# Patient Record
Sex: Female | Born: 1985 | Race: White | Hispanic: No | Marital: Married | Smoking: Never smoker
Health system: Southern US, Community
[De-identification: ages and names within clinical notes are randomized; demographics above are authoritative.]

## PROBLEM LIST (undated history)

## (undated) ENCOUNTER — Inpatient Hospital Stay (HOSPITAL_COMMUNITY): Payer: Self-pay

## (undated) DIAGNOSIS — F419 Anxiety disorder, unspecified: Secondary | ICD-10-CM

## (undated) DIAGNOSIS — G43909 Migraine, unspecified, not intractable, without status migrainosus: Secondary | ICD-10-CM

## (undated) DIAGNOSIS — F329 Major depressive disorder, single episode, unspecified: Secondary | ICD-10-CM

## (undated) DIAGNOSIS — F32A Depression, unspecified: Secondary | ICD-10-CM

## (undated) DIAGNOSIS — N200 Calculus of kidney: Secondary | ICD-10-CM

## (undated) DIAGNOSIS — R112 Nausea with vomiting, unspecified: Secondary | ICD-10-CM

## (undated) DIAGNOSIS — T8859XA Other complications of anesthesia, initial encounter: Secondary | ICD-10-CM

## (undated) DIAGNOSIS — Z9889 Other specified postprocedural states: Secondary | ICD-10-CM

## (undated) DIAGNOSIS — E0590 Thyrotoxicosis, unspecified without thyrotoxic crisis or storm: Secondary | ICD-10-CM

## (undated) DIAGNOSIS — T4145XA Adverse effect of unspecified anesthetic, initial encounter: Secondary | ICD-10-CM

## (undated) HISTORY — PX: APPENDECTOMY: SHX54

## (undated) HISTORY — PX: LITHOTRIPSY: SUR834

## (undated) HISTORY — DX: Anxiety disorder, unspecified: F41.9

## (undated) HISTORY — PX: CHOLECYSTECTOMY: SHX55

---
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
## 2007-07-17 ENCOUNTER — Ambulatory Visit (HOSPITAL_COMMUNITY): Admission: RE | Admit: 2007-07-17 | Discharge: 2007-07-17 | Payer: Self-pay | Admitting: Urology

## 2007-09-24 ENCOUNTER — Ambulatory Visit (HOSPITAL_COMMUNITY): Admission: RE | Admit: 2007-09-24 | Discharge: 2007-09-24 | Payer: Self-pay | Admitting: Surgery

## 2007-10-10 ENCOUNTER — Encounter: Payer: Self-pay | Admitting: Endocrinology

## 2007-10-13 ENCOUNTER — Ambulatory Visit (HOSPITAL_COMMUNITY): Admission: RE | Admit: 2007-10-13 | Discharge: 2007-10-13 | Payer: Self-pay | Admitting: Surgery

## 2007-10-17 ENCOUNTER — Encounter: Admission: RE | Admit: 2007-10-17 | Discharge: 2007-10-17 | Payer: Self-pay | Admitting: Surgery

## 2008-01-13 ENCOUNTER — Encounter: Payer: Self-pay | Admitting: Internal Medicine

## 2008-02-04 ENCOUNTER — Encounter: Payer: Self-pay | Admitting: Endocrinology

## 2008-03-02 ENCOUNTER — Ambulatory Visit (HOSPITAL_COMMUNITY): Admission: RE | Admit: 2008-03-02 | Discharge: 2008-03-02 | Payer: Self-pay | Admitting: Surgery

## 2008-08-18 ENCOUNTER — Encounter: Payer: Self-pay | Admitting: Endocrinology

## 2008-10-11 DIAGNOSIS — G44209 Tension-type headache, unspecified, not intractable: Secondary | ICD-10-CM | POA: Insufficient documentation

## 2008-10-12 ENCOUNTER — Ambulatory Visit: Payer: Self-pay | Admitting: Endocrinology

## 2008-10-12 DIAGNOSIS — E210 Primary hyperparathyroidism: Secondary | ICD-10-CM | POA: Insufficient documentation

## 2008-10-12 DIAGNOSIS — L680 Hirsutism: Secondary | ICD-10-CM | POA: Insufficient documentation

## 2008-10-12 DIAGNOSIS — R197 Diarrhea, unspecified: Secondary | ICD-10-CM | POA: Insufficient documentation

## 2008-10-12 DIAGNOSIS — F329 Major depressive disorder, single episode, unspecified: Secondary | ICD-10-CM | POA: Insufficient documentation

## 2008-10-12 DIAGNOSIS — R635 Abnormal weight gain: Secondary | ICD-10-CM | POA: Insufficient documentation

## 2008-10-12 DIAGNOSIS — N209 Urinary calculus, unspecified: Secondary | ICD-10-CM | POA: Insufficient documentation

## 2008-10-12 LAB — CONVERTED CEMR LAB
Cortisol, Plasma: 11.4 ug/dL
Prolactin: 17.9 ng/mL

## 2008-10-13 ENCOUNTER — Telehealth: Payer: Self-pay | Admitting: Endocrinology

## 2008-10-13 ENCOUNTER — Ambulatory Visit: Payer: Self-pay | Admitting: Endocrinology

## 2008-10-13 LAB — CONVERTED CEMR LAB: Cortisol, Plasma: 0.6 ug/dL

## 2008-10-26 LAB — CONVERTED CEMR LAB
Calcium, Total (PTH): 11.3 mg/dL — ABNORMAL HIGH (ref 8.4–10.5)
PTH: 91.8 pg/mL — ABNORMAL HIGH (ref 14.0–72.0)

## 2008-10-29 HISTORY — PX: PARATHYROIDECTOMY: SHX19

## 2008-12-14 ENCOUNTER — Encounter: Payer: Self-pay | Admitting: Endocrinology

## 2009-01-05 ENCOUNTER — Encounter: Payer: Self-pay | Admitting: Endocrinology

## 2009-01-13 ENCOUNTER — Encounter: Payer: Self-pay | Admitting: Endocrinology

## 2009-01-18 ENCOUNTER — Encounter: Payer: Self-pay | Admitting: Endocrinology

## 2009-10-31 ENCOUNTER — Ambulatory Visit (HOSPITAL_COMMUNITY): Admission: RE | Admit: 2009-10-31 | Discharge: 2009-10-31 | Payer: Self-pay | Admitting: Urology

## 2009-11-03 ENCOUNTER — Emergency Department (HOSPITAL_COMMUNITY): Admission: EM | Admit: 2009-11-03 | Discharge: 2009-11-04 | Payer: Self-pay | Admitting: Emergency Medicine

## 2009-11-22 ENCOUNTER — Ambulatory Visit (HOSPITAL_COMMUNITY): Admission: RE | Admit: 2009-11-22 | Discharge: 2009-11-22 | Payer: Self-pay | Admitting: Family Medicine

## 2009-12-16 ENCOUNTER — Encounter (INDEPENDENT_AMBULATORY_CARE_PROVIDER_SITE_OTHER): Payer: Self-pay | Admitting: General Surgery

## 2009-12-16 ENCOUNTER — Ambulatory Visit (HOSPITAL_COMMUNITY): Admission: RE | Admit: 2009-12-16 | Discharge: 2009-12-17 | Payer: Self-pay | Admitting: General Surgery

## 2010-10-11 ENCOUNTER — Emergency Department (HOSPITAL_BASED_OUTPATIENT_CLINIC_OR_DEPARTMENT_OTHER)
Admission: EM | Admit: 2010-10-11 | Discharge: 2010-10-12 | Payer: Self-pay | Source: Home / Self Care | Admitting: Emergency Medicine

## 2010-10-15 ENCOUNTER — Emergency Department (HOSPITAL_BASED_OUTPATIENT_CLINIC_OR_DEPARTMENT_OTHER)
Admission: EM | Admit: 2010-10-15 | Discharge: 2010-10-15 | Payer: Self-pay | Source: Home / Self Care | Admitting: Emergency Medicine

## 2010-10-29 NOTE — L&D Delivery Note (Signed)
Pt had pit aug added after her amniotomy. She progressed along a normal labor curve without complications. Second stage lasted about 1 hour. She had a SVD over 1st degree midline tear in ROA position. Placenta S/I. EBL-400cc. Tear closed with 3-0 chromic. Baby to NBN.

## 2010-11-19 ENCOUNTER — Encounter: Payer: Self-pay | Admitting: Surgery

## 2011-01-08 LAB — BASIC METABOLIC PANEL
BUN: 11 mg/dL (ref 6–23)
CO2: 21 mEq/L (ref 19–32)
Calcium: 9.5 mg/dL (ref 8.4–10.5)
Chloride: 110 mEq/L (ref 96–112)
Creatinine, Ser: 0.7 mg/dL (ref 0.4–1.2)
GFR calc Af Amer: >60 mL/min (ref >60)
GFR calc non Af Amer: >60 mL/min (ref >60)
Glucose, Bld: 104 mg/dL — ABNORMAL HIGH (ref 70–99)
Potassium: 3.9 mEq/L (ref 3.5–5.1)
Sodium: 144 mEq/L (ref 135–145)

## 2011-01-08 LAB — POCT TOXICOLOGY PANEL

## 2011-01-08 LAB — CBC
HCT: 40.9 % (ref 36.0–46.0)
Hemoglobin: 14 g/dL (ref 12.0–15.0)
MCH: 28.5 pg (ref 26.0–34.0)
MCHC: 34.2 g/dL (ref 30.0–36.0)
MCV: 83.1 fL (ref 78.0–100.0)
Platelets: 245 10*3/uL (ref 150–400)
RBC: 4.92 MIL/uL (ref 3.87–5.11)
RDW: 13.2 % (ref 11.5–15.5)
WBC: 5 10*3/uL (ref 4.0–10.5)

## 2011-01-08 LAB — DIFFERENTIAL
Basophils Absolute: 0 10*3/uL (ref 0.0–0.1)
Basophils Relative: 0 % (ref 0–1)
Eosinophils Absolute: 0 10*3/uL (ref 0.0–0.7)
Eosinophils Relative: 0 % (ref 0–5)
Lymphocytes Relative: 35 % (ref 12–46)
Lymphs Abs: 1.7 10*3/uL (ref 0.7–4.0)
Monocytes Absolute: 0.5 10*3/uL (ref 0.1–1.0)
Monocytes Relative: 9 % (ref 3–12)
Neutro Abs: 2.8 10*3/uL (ref 1.7–7.7)
Neutrophils Relative %: 56 % (ref 43–77)

## 2011-01-09 LAB — BASIC METABOLIC PANEL
BUN: 9 mg/dL (ref 6–23)
CO2: 19 mEq/L (ref 19–32)
Calcium: 10.1 mg/dL (ref 8.4–10.5)
Chloride: 109 mEq/L (ref 96–112)
Creatinine, Ser: 0.7 mg/dL (ref 0.4–1.2)
GFR calc Af Amer: >60 mL/min (ref >60)
GFR calc non Af Amer: >60 mL/min (ref >60)
Glucose, Bld: 94 mg/dL (ref 70–99)
Potassium: 4.8 mEq/L (ref 3.5–5.1)
Sodium: 143 mEq/L (ref 135–145)

## 2011-01-09 LAB — URINALYSIS, ROUTINE W REFLEX MICROSCOPIC
Bilirubin Urine: NEGATIVE
Glucose, UA: NEGATIVE mg/dL
Hgb urine dipstick: NEGATIVE
Ketones, ur: NEGATIVE mg/dL
Nitrite: NEGATIVE
Protein, ur: NEGATIVE mg/dL
Specific Gravity, Urine: 1.009 (ref 1.005–1.030)
Urobilinogen, UA: 0.2 mg/dL (ref 0.0–1.0)
pH: 6.5 (ref 5.0–8.0)

## 2011-01-09 LAB — MONONUCLEOSIS SCREEN: Mono Screen: NEGATIVE

## 2011-01-09 LAB — PREGNANCY, URINE: Preg Test, Ur: NEGATIVE

## 2011-01-13 LAB — LIPASE, BLOOD: Lipase: 17 U/L (ref 11–59)

## 2011-01-13 LAB — URINALYSIS, ROUTINE W REFLEX MICROSCOPIC
Bilirubin Urine: NEGATIVE
Glucose, UA: NEGATIVE mg/dL
Ketones, ur: 40 mg/dL — AB
Nitrite: NEGATIVE
Protein, ur: NEGATIVE mg/dL
Specific Gravity, Urine: 1.016 (ref 1.005–1.030)
Urobilinogen, UA: 0.2 mg/dL (ref 0.0–1.0)
pH: 6 (ref 5.0–8.0)

## 2011-01-13 LAB — DIFFERENTIAL
Basophils Absolute: 0 10*3/uL (ref 0.0–0.1)
Basophils Relative: 0 % (ref 0–1)
Eosinophils Absolute: 0 10*3/uL (ref 0.0–0.7)
Eosinophils Relative: 0 % (ref 0–5)
Lymphocytes Relative: 21 % (ref 12–46)
Lymphs Abs: 1.9 10*3/uL (ref 0.7–4.0)
Monocytes Absolute: 0.4 10*3/uL (ref 0.1–1.0)
Monocytes Relative: 5 % (ref 3–12)
Neutro Abs: 6.4 10*3/uL (ref 1.7–7.7)
Neutrophils Relative %: 73 % (ref 43–77)

## 2011-01-13 LAB — CBC
HCT: 42.1 % (ref 36.0–46.0)
Hemoglobin: 14.2 g/dL (ref 12.0–15.0)
MCHC: 33.6 g/dL (ref 30.0–36.0)
MCV: 88.6 fL (ref 78.0–100.0)
Platelets: 264 10*3/uL (ref 150–400)
RBC: 4.76 MIL/uL (ref 3.87–5.11)
RDW: 13.6 % (ref 11.5–15.5)
WBC: 8.7 10*3/uL (ref 4.0–10.5)

## 2011-01-13 LAB — COMPREHENSIVE METABOLIC PANEL
ALT: 126 U/L — ABNORMAL HIGH (ref 0–35)
AST: 67 U/L — ABNORMAL HIGH (ref 0–37)
Albumin: 4.5 g/dL (ref 3.5–5.2)
Alkaline Phosphatase: 55 U/L (ref 39–117)
BUN: 6 mg/dL (ref 6–23)
CO2: 26 mEq/L (ref 19–32)
Calcium: 9.7 mg/dL (ref 8.4–10.5)
Chloride: 106 mEq/L (ref 96–112)
Creatinine, Ser: 0.66 mg/dL (ref 0.4–1.2)
GFR calc Af Amer: >60 mL/min (ref >60)
GFR calc non Af Amer: >60 mL/min (ref >60)
Glucose, Bld: 112 mg/dL — ABNORMAL HIGH (ref 70–99)
Potassium: 3.9 mEq/L (ref 3.5–5.1)
Sodium: 142 mEq/L (ref 135–145)
Total Bilirubin: 0.5 mg/dL (ref 0.3–1.2)
Total Protein: 7.6 g/dL (ref 6.0–8.3)

## 2011-01-13 LAB — URINE MICROSCOPIC-ADD ON

## 2011-01-13 LAB — PREGNANCY, URINE: Preg Test, Ur: NEGATIVE

## 2011-01-14 LAB — PREGNANCY, URINE: Preg Test, Ur: NEGATIVE

## 2011-01-17 LAB — PREGNANCY, URINE: Preg Test, Ur: NEGATIVE
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
## 2011-03-16 NOTE — Discharge Summary (Signed)
[facility]  Patient:    Anita Beck, Anita Beck                      MRN: [id number] Adm. Date:  [id number] Disc. Date: [id number] Attending:  Veneta Penton                           Discharge Summary  REASON FOR ADMISSION:  This 25 year old white female was admitted complaining of depression with suicidal ideation and an attempt to kill herself by cutting her left wrist.  For further history of present illness, please see the patients psychiatric admission assessment.  PHYSICAL EXAMINATION:  At the time of admission was significant for obesity, asthma, and allergic rhinitis.  She had an otherwise unremarkable physical examination.  LABORATORY EXAMINATION:  The patient underwent a laboratory work-up to rule out any medical problems contributing to her symptomatology.  A urine probe for gonorrhea and chlamydia was negative.  A routine chem panel was within normal limits, with exception of a calcium of 11.0.  A hepatic panel was within normal limits.  CBC showed an RDW of 14.1% and was otherwise unremarkable.  Urine pregnancy test was negative.  Thyroid function tests was within normal limits.  A urine drug screen was negative.  An RPR was nonreactive.  UA was within normal limits.  Patient received no x-rays, no special procedures, no additional consultations.  She sustained no complications during the course of this hospitalization.  HOSPITAL COURSE:  On admission, the patient was depressed, irritable, angry, oppositional and defiant.  She showed poor impulse control, a furrowed brow, decreased concentration.  She was psychomotor agitated.  She was continued on a trial of Celexa and titrated upwards to a therapeutic dose.  At the time of discharge, her affect and mood have improved.  She is participating in all aspects of the therapeutic treatment program.  She denies and homicidal or suicidal ideation.  She is motivated for outpatient therapy, has  been addressing her issues with regard to dealing with the separation and divorce of her parents as well as the breakup she had with a boyfriend that she was close to.  She is felt to have reached the maximum benefits of hospitalization and is ready for discharge to a less restricted alternative setting.  CONDITION ON DISCHARGE:  Improved  FINAL DIAGNOSIS: Axis I:    1. Major depression, single episode, severe, without psychosis.            2. Oppositional-defiant disorder. Axis II:   None. Axis III:  Obesity, asthma, allergic rhinitis. Axis IV:   Current psychosocial stressors are severe. Axis V:    Code 20 on admission, code 30 on discharge.  FURTHER EVALUATION AND TREATMENT RECOMMENDATIONS: 1. The patient is discharged to home. 2. She is discharged on an unrestricted level of activity and a regular diet. 3. She will follow up with her primary care physician, Dr. Foy Guadalajara for further    medication management and all other aspects of her health care.  She will    also follow up with Ripley Fraise, her outpatient therapist, for all further    psychotherapy. Consequently, I will sign off on the case at this time.  DISCHARGE MEDICATIONS: 1. Celexa 20 mg p.o. q.d. 2. Albuterol inhaler as directed by her primary care physician for wheezing. DD:  02/20/01 TD:  02/20/01 Job: [id number]

## 2011-03-26 ENCOUNTER — Inpatient Hospital Stay (HOSPITAL_COMMUNITY): Payer: BC Managed Care – PPO

## 2011-03-26 ENCOUNTER — Encounter (HOSPITAL_COMMUNITY): Payer: Self-pay | Admitting: Radiology

## 2011-03-26 ENCOUNTER — Inpatient Hospital Stay (HOSPITAL_COMMUNITY)
Admission: AD | Admit: 2011-03-26 | Discharge: 2011-03-26 | Disposition: A | Discharge status: Home / Self Care | Payer: BC Managed Care – PPO | Source: Ambulatory Visit | Attending: Obstetrics and Gynecology | Admitting: Obstetrics and Gynecology

## 2011-03-26 DIAGNOSIS — R109 Unspecified abdominal pain: Secondary | ICD-10-CM | POA: Insufficient documentation

## 2011-03-26 DIAGNOSIS — R52 Pain, unspecified: Secondary | ICD-10-CM

## 2011-03-26 DIAGNOSIS — O99891 Other specified diseases and conditions complicating pregnancy: Secondary | ICD-10-CM | POA: Insufficient documentation

## 2011-03-26 LAB — URINALYSIS, ROUTINE W REFLEX MICROSCOPIC
Bilirubin Urine: NEGATIVE
Glucose, UA: NEGATIVE mg/dL
Hgb urine dipstick: NEGATIVE
Ketones, ur: NEGATIVE mg/dL
Nitrite: NEGATIVE
Protein, ur: NEGATIVE mg/dL
Specific Gravity, Urine: 1.025 (ref 1.005–1.030)
Urobilinogen, UA: 0.2 mg/dL (ref 0.0–1.0)
pH: 6 (ref 5.0–8.0)

## 2011-03-26 LAB — POCT PREGNANCY, URINE: Preg Test, Ur: POSITIVE

## 2011-03-26 LAB — URINE MICROSCOPIC-ADD ON

## 2011-05-04 ENCOUNTER — Other Ambulatory Visit (HOSPITAL_COMMUNITY): Payer: Self-pay | Admitting: Obstetrics and Gynecology

## 2011-05-04 DIAGNOSIS — Z3682 Encounter for antenatal screening for nuchal translucency: Secondary | ICD-10-CM

## 2011-05-10 ENCOUNTER — Ambulatory Visit (HOSPITAL_COMMUNITY)
Admission: RE | Admit: 2011-05-10 | Discharge: 2011-05-10 | Disposition: A | Discharge status: Home / Self Care | Payer: PRIVATE HEALTH INSURANCE | Source: Ambulatory Visit | Attending: Obstetrics and Gynecology | Admitting: Obstetrics and Gynecology

## 2011-05-10 ENCOUNTER — Ambulatory Visit (HOSPITAL_COMMUNITY): Admission: RE | Admit: 2011-05-10 | Payer: PRIVATE HEALTH INSURANCE | Source: Ambulatory Visit

## 2011-05-10 ENCOUNTER — Other Ambulatory Visit (HOSPITAL_COMMUNITY): Payer: Self-pay | Admitting: Obstetrics and Gynecology

## 2011-05-10 ENCOUNTER — Encounter (HOSPITAL_COMMUNITY): Payer: Self-pay

## 2011-05-10 DIAGNOSIS — E669 Obesity, unspecified: Secondary | ICD-10-CM | POA: Insufficient documentation

## 2011-05-10 DIAGNOSIS — Z3689 Encounter for other specified antenatal screening: Secondary | ICD-10-CM | POA: Insufficient documentation

## 2011-05-10 DIAGNOSIS — Z0489 Encounter for examination and observation for other specified reasons: Secondary | ICD-10-CM

## 2011-05-10 DIAGNOSIS — Z3682 Encounter for antenatal screening for nuchal translucency: Secondary | ICD-10-CM

## 2011-05-10 DIAGNOSIS — O351XX0 Maternal care for (suspected) chromosomal abnormality in fetus, not applicable or unspecified: Secondary | ICD-10-CM | POA: Insufficient documentation

## 2011-05-10 DIAGNOSIS — O3510X0 Maternal care for (suspected) chromosomal abnormality in fetus, unspecified, not applicable or unspecified: Secondary | ICD-10-CM | POA: Insufficient documentation

## 2011-05-14 LAB — ANTIBODY SCREEN: Antibody Screen: NEGATIVE

## 2011-05-14 LAB — RPR: RPR: NONREACTIVE

## 2011-05-14 LAB — ABO/RH: RH Type: POSITIVE

## 2011-05-14 LAB — HIV ANTIBODY (ROUTINE TESTING W REFLEX): HIV: NONREACTIVE

## 2011-05-14 LAB — HEPATITIS B SURFACE ANTIGEN: Hepatitis B Surface Ag: NEGATIVE

## 2011-05-14 LAB — RUBELLA ANTIBODY, IGM: Rubella: IMMUNE

## 2011-06-15 ENCOUNTER — Other Ambulatory Visit (HOSPITAL_COMMUNITY): Payer: Self-pay | Admitting: Obstetrics and Gynecology

## 2011-06-15 ENCOUNTER — Ambulatory Visit (HOSPITAL_COMMUNITY)
Admission: RE | Admit: 2011-06-15 | Discharge: 2011-06-15 | Disposition: A | Discharge status: Home / Self Care | Payer: PRIVATE HEALTH INSURANCE | Source: Ambulatory Visit | Attending: Obstetrics and Gynecology | Admitting: Obstetrics and Gynecology

## 2011-06-15 DIAGNOSIS — Z0489 Encounter for examination and observation for other specified reasons: Secondary | ICD-10-CM

## 2011-06-15 DIAGNOSIS — IMO0002 Reserved for concepts with insufficient information to code with codable children: Secondary | ICD-10-CM

## 2011-06-15 DIAGNOSIS — O358XX0 Maternal care for other (suspected) fetal abnormality and damage, not applicable or unspecified: Secondary | ICD-10-CM | POA: Insufficient documentation

## 2011-06-15 DIAGNOSIS — O99210 Obesity complicating pregnancy, unspecified trimester: Secondary | ICD-10-CM | POA: Insufficient documentation

## 2011-06-15 DIAGNOSIS — E669 Obesity, unspecified: Secondary | ICD-10-CM | POA: Insufficient documentation

## 2011-06-15 NOTE — Progress Notes (Signed)
Patient seen for ultrasound only appointment today.  Please see [facility]-OBGYN report for details.  

## 2011-06-22 ENCOUNTER — Encounter (HOSPITAL_COMMUNITY): Payer: Self-pay | Admitting: *Deleted

## 2011-06-22 ENCOUNTER — Inpatient Hospital Stay (HOSPITAL_COMMUNITY)
Admission: AD | Admit: 2011-06-22 | Discharge: 2011-06-22 | Disposition: A | Discharge status: Home / Self Care | Payer: PRIVATE HEALTH INSURANCE | Source: Ambulatory Visit | Attending: Obstetrics & Gynecology | Admitting: Obstetrics & Gynecology

## 2011-06-22 DIAGNOSIS — O99891 Other specified diseases and conditions complicating pregnancy: Secondary | ICD-10-CM | POA: Insufficient documentation

## 2011-06-22 DIAGNOSIS — R630 Anorexia: Secondary | ICD-10-CM

## 2011-06-22 DIAGNOSIS — R42 Dizziness and giddiness: Secondary | ICD-10-CM | POA: Insufficient documentation

## 2011-06-22 DIAGNOSIS — O26899 Other specified pregnancy related conditions, unspecified trimester: Secondary | ICD-10-CM

## 2011-06-22 DIAGNOSIS — R51 Headache: Secondary | ICD-10-CM

## 2011-06-22 HISTORY — DX: Depression, unspecified: F32.A

## 2011-06-22 HISTORY — DX: Thyrotoxicosis, unspecified without thyrotoxic crisis or storm: E05.90

## 2011-06-22 HISTORY — DX: Major depressive disorder, single episode, unspecified: F32.9

## 2011-06-22 LAB — GLUCOSE, CAPILLARY: Glucose-Capillary: 89 mg/dL (ref 70–99)

## 2011-06-22 LAB — URINALYSIS, ROUTINE W REFLEX MICROSCOPIC
Bilirubin Urine: NEGATIVE
Glucose, UA: NEGATIVE mg/dL
Hgb urine dipstick: NEGATIVE
Ketones, ur: NEGATIVE mg/dL
Leukocytes, UA: NEGATIVE
Nitrite: NEGATIVE
Protein, ur: NEGATIVE mg/dL
Specific Gravity, Urine: 1.015 (ref 1.005–1.030)
Urobilinogen, UA: 0.2 mg/dL (ref 0.0–1.0)
pH: 7 (ref 5.0–8.0)

## 2011-06-22 MED ORDER — BUTALBITAL-APAP-CAFFEINE 50-325-40 MG PO TABS
1.0000 | ORAL_TABLET | Freq: Once | ORAL | Status: AC
  Administered 2011-06-22: 1 via ORAL
  Filled 2011-06-22: qty 1

## 2011-06-22 MED ORDER — ACETAMINOPHEN 325 MG PO TABS
650.0000 mg | ORAL_TABLET | Freq: Once | ORAL | Status: AC
  Administered 2011-06-22: 650 mg via ORAL
  Filled 2011-06-22: qty 2

## 2011-06-22 MED ORDER — BUTALBITAL-APAP-CAFFEINE 50-325-40 MG PO TABS: 1.0000 | ORAL_TABLET | Freq: Four times a day (QID) | ORAL | Status: DC | PRN

## 2011-06-22 NOTE — ED Provider Notes (Signed)
History     CSN: [id number] Arrival date & time: 06/22/2011  4:39 PM  Chief Complaint  Patient presents with  . Back Pain  . Leg Swelling  . Headache  . Dizziness   HPI  Anita Beck, 25 y.o. patient of Dr. Kittie Plater presents with a 2 day hx of dizziness at work.  "coming more often"  Had heavy menstrual like cramping early this am and more dizziness at work which has resolved.  Took Tylenol PM this am to sleep.  Woke with cramping in her legs, headache, numbness in her hands, and back pain.  Had migraines last Dec, treated X 2  In the ED.  Hasn't had them since.   Denies uterine contractions, vaginal bleeding or leaking of fluid.  Patient states she is unable to eat because chewing makes her sick.  She is also going most of her 12 hr shifts without drinking very much and not eating.  She is working 3 12hrs shifts in a row.   Past Medical History  Diagnosis Date  . Hyperthyroidism   . Depression     Past Surgical History  Procedure Date  . No past surgeries     No family history on file.  History  Substance Use Topics  . Smoking status: Never Smoker   . Smokeless tobacco: Not on file  . Alcohol Use: No    OB History    Grav Para Term Preterm Abortions TAB SAB Ect Mult Living   1               Review of Systems  Musculoskeletal: Positive for back pain.  Neurological: Positive for dizziness, numbness (in hands) and headaches.    Physical Exam  BP 128/65  Pulse 95  Temp 99.1 F (37.3 C)  Resp 20  Ht 5\' 5"  (1.651 m)  Wt 240 lb 6 oz (109.033 kg)  BMI 40.00 kg/m2  LMP 01/29/2011  Physical Exam  Constitutional: She is oriented to person, place, and time. She appears well-developed and well-nourished.  Neck: Normal range of motion.  Abdominal: Soft. There is no tenderness.  Genitourinary:       Patient declined vaginal exam  Neurological: She is alert and oriented to person, place, and time.  Skin: Skin is warm and dry.    ED Course  Procedures UA, CBG  and Tylenol 650mg  po ordered.   Patient was offered a meal.  She asked for Coke and saltine crackers.   MDM Called Dr. Aldona Bar at 19:25 regarding Patient's complaint.  He states he gave orders for NP to evaluate the patient.  I was not aware of this and will be glad to evaluate the patient.   Dr. Aldona Bar came by and asked about patient.  Order given for Fioricet at time of discharge for headache.     Assessment:  Headache at [redacted]wks gestation                         Dizziness                        Anorexia  Plan:  Strongly encouraged patient to eat small meals often and stay well hydrated to reduce muscle cramping and headaches.  \           Keep scheduled appointment with Dr. Henderson Cloud and call her if her sxs worsen           Rx given for  Fioricet #1 tab q6hr prn headache.  #10 with no RF         Note given for out of work tonight.    At time of discharge, patient reported to Victorino Dike, RN that her pharmacy is now closed and asked if she could have 1 Fioricet tab here.  Fioricet 1 tab po ordered for patient.

## 2011-06-22 NOTE — Discharge Instructions (Signed)
It is very important to find foods that you can tolerate.  Eat small meals 5-6 times a day.  Also drink 6-8 glasses of fluid a day to reduce dehydration, muscle cramping, dizziness and headache.

## 2011-06-22 NOTE — Progress Notes (Signed)
Pt states, " I started having low back cramping and dizziness at 0500 today while at work. I took tylenol pm when I got home and slept until 2 pm. Now the dizziness is worse and I have a headache. My hands and arms hurt, and feel swollen, and my legs cramp and my feet numb."

## 2011-07-13 ENCOUNTER — Ambulatory Visit (HOSPITAL_COMMUNITY): Payer: PRIVATE HEALTH INSURANCE

## 2011-08-09 LAB — PREGNANCY, URINE: Preg Test, Ur: NEGATIVE

## 2011-09-23 ENCOUNTER — Encounter (HOSPITAL_COMMUNITY): Payer: Self-pay | Admitting: Obstetrics and Gynecology

## 2011-09-23 ENCOUNTER — Inpatient Hospital Stay (HOSPITAL_COMMUNITY)
Admission: AD | Admit: 2011-09-23 | Discharge: 2011-09-25 | DRG: 778 | Disposition: A | Discharge status: Home / Self Care | Payer: PRIVATE HEALTH INSURANCE | Source: Ambulatory Visit | Attending: Obstetrics & Gynecology | Admitting: Obstetrics & Gynecology

## 2011-09-23 DIAGNOSIS — O139 Gestational [pregnancy-induced] hypertension without significant proteinuria, unspecified trimester: Secondary | ICD-10-CM | POA: Diagnosis present

## 2011-09-23 DIAGNOSIS — O4700 False labor before 37 completed weeks of gestation, unspecified trimester: Principal | ICD-10-CM | POA: Diagnosis present

## 2011-09-23 HISTORY — DX: Migraine, unspecified, not intractable, without status migrainosus: G43.909

## 2011-09-23 HISTORY — DX: Calculus of kidney: N20.0

## 2011-09-23 LAB — CBC
HCT: 34.1 % — ABNORMAL LOW (ref 36.0–46.0)
Hemoglobin: 11.1 g/dL — ABNORMAL LOW (ref 12.0–15.0)
MCH: 28.2 pg (ref 26.0–34.0)
MCHC: 32.6 g/dL (ref 30.0–36.0)
MCV: 86.5 fL (ref 78.0–100.0)
Platelets: 183 10*3/uL (ref 150–400)
RBC: 3.94 MIL/uL (ref 3.87–5.11)
RDW: 13.9 % (ref 11.5–15.5)
WBC: 7.9 10*3/uL (ref 4.0–10.5)

## 2011-09-23 LAB — COMPREHENSIVE METABOLIC PANEL
ALT: 15 U/L (ref 0–35)
AST: 27 U/L (ref 0–37)
Albumin: 2.7 g/dL — ABNORMAL LOW (ref 3.5–5.2)
Alkaline Phosphatase: 93 U/L (ref 39–117)
BUN: 3 mg/dL — ABNORMAL LOW (ref 6–23)
CO2: 23 mEq/L (ref 19–32)
Calcium: 9.6 mg/dL (ref 8.4–10.5)
Chloride: 102 mEq/L (ref 96–112)
Creatinine, Ser: 0.49 mg/dL — ABNORMAL LOW (ref 0.50–1.10)
GFR calc Af Amer: >90 mL/min (ref >90)
GFR calc non Af Amer: >90 mL/min (ref >90)
Glucose, Bld: 82 mg/dL (ref 70–99)
Potassium: 3.4 mEq/L — ABNORMAL LOW (ref 3.5–5.1)
Sodium: 136 mEq/L (ref 135–145)
Total Bilirubin: 0.2 mg/dL — ABNORMAL LOW (ref 0.3–1.2)
Total Protein: 5.9 g/dL — ABNORMAL LOW (ref 6.0–8.3)

## 2011-09-23 LAB — DIFFERENTIAL
Basophils Absolute: 0 10*3/uL (ref 0.0–0.1)
Basophils Relative: 0 % (ref 0–1)
Eosinophils Absolute: 0 10*3/uL (ref 0.0–0.7)
Eosinophils Relative: 0 % (ref 0–5)
Lymphocytes Relative: 17 % (ref 12–46)
Lymphs Abs: 1.3 10*3/uL (ref 0.7–4.0)
Monocytes Absolute: 0.6 10*3/uL (ref 0.1–1.0)
Monocytes Relative: 8 % (ref 3–12)
Neutro Abs: 6 10*3/uL (ref 1.7–7.7)
Neutrophils Relative %: 76 % (ref 43–77)

## 2011-09-23 LAB — URINALYSIS, ROUTINE W REFLEX MICROSCOPIC
Bilirubin Urine: NEGATIVE
Glucose, UA: NEGATIVE mg/dL
Hgb urine dipstick: NEGATIVE
Ketones, ur: NEGATIVE mg/dL
Nitrite: NEGATIVE
Protein, ur: NEGATIVE mg/dL
Specific Gravity, Urine: 1.025 (ref 1.005–1.030)
Urobilinogen, UA: 0.2 mg/dL (ref 0.0–1.0)
pH: 6 (ref 5.0–8.0)

## 2011-09-23 LAB — STREP B DNA PROBE: GBS: POSITIVE

## 2011-09-23 LAB — URINE MICROSCOPIC-ADD ON

## 2011-09-23 LAB — FETAL FIBRONECTIN: Fetal Fibronectin: POSITIVE — AB

## 2011-09-23 MED ORDER — SULFAMETHOXAZOLE-TMP DS 800-160 MG PO TABS
1.0000 | ORAL_TABLET | Freq: Once | ORAL | Status: AC
  Administered 2011-09-23: 1 via ORAL
  Filled 2011-09-23: qty 1

## 2011-09-23 MED ORDER — DOCUSATE SODIUM 100 MG PO CAPS
100.0000 mg | ORAL_CAPSULE | Freq: Every day | ORAL | Status: DC
  Administered 2011-09-23 – 2011-09-24 (×2): 100 mg via ORAL
  Filled 2011-09-23 (×5): qty 1

## 2011-09-23 MED ORDER — LACTATED RINGERS IV SOLN
INTRAVENOUS | Status: DC
  Administered 2011-09-23 – 2011-09-25 (×5): via INTRAVENOUS

## 2011-09-23 MED ORDER — PENICILLIN G POTASSIUM 5000000 UNITS IJ SOLR
2.5000 10*6.[IU] | INTRAVENOUS | Status: DC
  Administered 2011-09-23 – 2011-09-24 (×4): 2.5 10*6.[IU] via INTRAVENOUS
  Filled 2011-09-23 (×6): qty 2.5

## 2011-09-23 MED ORDER — BUTORPHANOL TARTRATE 2 MG/ML IJ SOLN: 1.0000 mg | INTRAMUSCULAR | Status: DC | PRN

## 2011-09-23 MED ORDER — BETAMETHASONE SOD PHOS & ACET 6 (3-3) MG/ML IJ SUSP
12.0000 mg | INTRAMUSCULAR | Status: AC
  Administered 2011-09-24: 12 mg via INTRAMUSCULAR
  Filled 2011-09-23: qty 2

## 2011-09-23 MED ORDER — MAGNESIUM SULFATE BOLUS VIA INFUSION
4.0000 g | Freq: Once | INTRAVENOUS | Status: AC
  Administered 2011-09-23: 4 g via INTRAVENOUS
  Filled 2011-09-23: qty 500

## 2011-09-23 MED ORDER — MAGNESIUM SULFATE 40 G IN LACTATED RINGERS - SIMPLE
2.0000 g/h | INTRAVENOUS | Status: DC
  Administered 2011-09-23: 2 g/h via INTRAVENOUS
  Filled 2011-09-23: qty 500

## 2011-09-23 MED ORDER — ONDANSETRON 8 MG PO TBDP
8.0000 mg | ORAL_TABLET | Freq: Once | ORAL | Status: DC
  Filled 2011-09-23 (×2): qty 1

## 2011-09-23 MED ORDER — ZOLPIDEM TARTRATE 10 MG PO TABS
10.0000 mg | ORAL_TABLET | Freq: Every evening | ORAL | Status: DC | PRN
  Administered 2011-09-23 – 2011-09-24 (×2): 10 mg via ORAL
  Filled 2011-09-23 (×2): qty 1

## 2011-09-23 MED ORDER — ACETAMINOPHEN 325 MG PO TABS
650.0000 mg | ORAL_TABLET | ORAL | Status: DC | PRN
  Administered 2011-09-23: 650 mg via ORAL
  Filled 2011-09-23: qty 2

## 2011-09-23 MED ORDER — BETAMETHASONE SOD PHOS & ACET 6 (3-3) MG/ML IJ SUSP: 12.0000 mg | Freq: Once | INTRAMUSCULAR | Status: DC

## 2011-09-23 MED ORDER — PENICILLIN G POTASSIUM 5000000 UNITS IJ SOLR
5.0000 10*6.[IU] | Freq: Once | INTRAVENOUS | Status: AC
  Administered 2011-09-23: 5 10*6.[IU] via INTRAVENOUS
  Filled 2011-09-23: qty 5

## 2011-09-23 MED ORDER — NIFEDIPINE 10 MG PO CAPS
10.0000 mg | ORAL_CAPSULE | Freq: Once | ORAL | Status: AC
  Administered 2011-09-23: 10 mg via ORAL
  Filled 2011-09-23: qty 1

## 2011-09-23 MED ORDER — PROMETHAZINE HCL 25 MG/ML IJ SOLN
25.0000 mg | Freq: Once | INTRAMUSCULAR | Status: AC
  Administered 2011-09-23: 25 mg via INTRAVENOUS
  Filled 2011-09-23: qty 1

## 2011-09-23 MED ORDER — BETAMETHASONE SOD PHOS & ACET 6 (3-3) MG/ML IJ SUSP
12.0000 mg | Freq: Once | INTRAMUSCULAR | Status: AC
  Administered 2011-09-23: 12 mg via INTRAMUSCULAR
  Filled 2011-09-23: qty 2

## 2011-09-23 MED ORDER — PRENATAL PLUS 27-1 MG PO TABS
1.0000 | ORAL_TABLET | Freq: Every day | ORAL | Status: DC
  Administered 2011-09-23: 1 via ORAL
  Filled 2011-09-23 (×4): qty 1

## 2011-09-23 MED ORDER — OXYCODONE-ACETAMINOPHEN 5-325 MG PO TABS
1.0000 | ORAL_TABLET | Freq: Four times a day (QID) | ORAL | Status: DC | PRN
  Administered 2011-09-23: 2 via ORAL
  Administered 2011-09-24 (×3): 1 via ORAL
  Administered 2011-09-25: 2 via ORAL
  Filled 2011-09-23 (×3): qty 1
  Filled 2011-09-23 (×2): qty 2

## 2011-09-23 NOTE — Progress Notes (Signed)
Pt presents to MAU with chief complaint of vaginal pressure that started yesterday. Says she feels like she has to go to the bathroom, but does not go when she feels like she has to. She is also experiencing N/V. 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

## 2011-09-23 NOTE — ED Provider Notes (Signed)
History     Chief Complaint  Patient presents with  . Abdominal Pain  . Emesis   HPI . OB History    Grav Para Term Preterm Abortions TAB SAB Ect Mult Living   1 0 0 0 0 0 0 0 0 0       Past Medical History  Diagnosis Date  . Hyperthyroidism   . Depression   . Kidney stones     Past Surgical History  Procedure Date  . No past surgeries   . Cholecystectomy   . Appendectomy   . Parathyroidectomy   . Lithotripsy     No family history on file.  History  Substance Use Topics  . Smoking status: Never Smoker   . Smokeless tobacco: Not on file  . Alcohol Use: No    Allergies:  Allergies  Allergen Reactions  . Aspirin Anaphylaxis    Prescriptions prior to admission  Medication Sig Dispense Refill  . butalbital-acetaminophen-caffeine (FIORICET) 50-325-40 MG per tablet Take 1 tablet by mouth every 6 (six) hours as needed for headache.  10 tablet  0  . cyclobenzaprine (FLEXERIL) 5 MG tablet Take 5 mg by mouth daily as needed. For headache       . diphenhydramine-acetaminophen (TYLENOL PM) 25-500 MG TABS Take 1 tablet by mouth at bedtime as needed.        . Famotidine (PEPCID PO) Take 1 tablet by mouth daily as needed. Patient takes medication for heartburn.       Marland Kitchen omeprazole (PRILOSEC) 10 MG capsule Take 10 mg by mouth daily.        Marland Kitchen oxycodone-acetaminophen (ROXICET) 5-500 MG per tablet Take 0.5 tablets by mouth every 6 (six) hours as needed. For pain       . prenatal vitamin w/FE, FA (PRENATAL 1 + 1) 27-1 MG TABS Take 1 tablet by mouth daily.        . promethazine (PHENERGAN) 25 MG tablet Take 25 mg by mouth as needed. For nausea         Review of Systems  Gastrointestinal: Positive for nausea, vomiting and abdominal pain.  Musculoskeletal: Positive for back pain.   Physical Exam   Blood pressure 139/79, pulse 103, temperature 98.3 F (36.8 C), temperature source Oral, resp. rate 20, height 5\' 6"  (1.676 m), weight 252 lb 3.2 oz (114.397 kg), last menstrual  period 01/29/2011.  Physical Exam  Nursing note and vitals reviewed. Constitutional: She is oriented to person, place, and time. She appears well-developed and well-nourished.  HENT:  Head: Normocephalic.  Eyes: EOM are normal.  Neck: Neck supple.  GI: Soft. There is tenderness. There is no rebound and no guarding.       Has pain in upper abdomen - above the fundus. On fetal monitor, irreg contractions   Genitourinary:       Speculum exam: Vulva - negative Vagina - Small amount of creamy discharge, no odor Cervix - No contact bleeding, fetal fibronectin collected Bimanual exam: Cervix very soft, external os open, internal os FT and very soft, babys head near cervix but at -2 station, Uterus gravid Adnexa non tender, no masses bilaterally Chaperone present for exam.  Musculoskeletal: Normal range of motion.  Neurological: She is alert and oriented to person, place, and time.  Skin: Skin is warm and dry.  Psychiatric: She has a normal mood and affect.    MAU Course  Procedures  MDM Discussed plan of care with Dr. Aldona Bar, and updated Dr. Aldona Bar again.  IV fluids  infused.  Talked with client about plan of care and medications.  Contractions barely visible on monitor, however client reports increased pressure.  Cervical exam = now 1 cm and soft.  Dr. Aldona Bar notified.  Assessment and Plan  UTI Preterm cervical changes  Plan Dr. Aldona Bar in to see client and check cervix.  Dr. Aldona Bar assumes care.  BURLESON,TERRI 09/23/2011, 1:36 PM   Nolene Bernheim, NP 09/23/11 1619

## 2011-09-23 NOTE — Progress Notes (Addendum)
Pt reports having lower abd pain  and cramping since yesterday. Reports having one episodes of vomiting this morning. " I just don't feel good today". Has been treated for a migraine for past week and a half with percocet phenergan and flexaril. Still has headache now. Medication helps but it make her sleep and when she wakes up the headache starts again.

## 2011-09-23 NOTE — H&P (Signed)
25 year old G1P0 at 42 weeks and 5 days pregnant admitted for PreTerm Labor - with documented cervical change.  Anita Beck has not been feeling well for 24-36 hours-back discomfort, abdominal cramping and some nausea.  Worked yesterday.  Today presented at MAU for evaluation and initially Cx was ext os FT and on my exam around 4PM was a good 1CM+/50-60% effaced/Vtx-3.  Contractions are difficult to pick up on the monitor but the patient says they are most noticeable when she is standing or walking.  There has been a question of S>D recently.  Headaches has also been a recent problem.  Fibronectin in MAU was POSITIVE.  ALLERGIC to ASA.  PE:  VS stable.  No fever Chest - clear CV - RR without murmur Abd:  S+ 34/35.  FH+ CX: 1+/50-60/vtx-2-3  IMP:  31-32 weeks IUP          PTL with documented cervical change and positive fibronectin          S>D          Obesity          R/O UTI  (C/S pending)  Plan:  IV Magnesium Sulfate, Betamethasone protocol, GBS pending--will give IV PCN until culture returns, U/S 11/26.

## 2011-09-24 ENCOUNTER — Inpatient Hospital Stay (HOSPITAL_COMMUNITY): Payer: PRIVATE HEALTH INSURANCE

## 2011-09-24 LAB — URINE CULTURE
Colony Count: NO GROWTH
Culture  Setup Time: 201211251644
Culture: NO GROWTH

## 2011-09-24 LAB — MAGNESIUM: Magnesium: 3.6 mg/dL — ABNORMAL HIGH (ref 1.5–2.5)

## 2011-09-24 MED ORDER — NIFEDIPINE ER 30 MG PO TB24
30.0000 mg | ORAL_TABLET | Freq: Every day | ORAL | Status: DC
  Administered 2011-09-24 – 2011-09-25 (×2): 30 mg via ORAL
  Filled 2011-09-24 (×3): qty 1

## 2011-09-24 NOTE — Progress Notes (Signed)
UR chart review completed.  

## 2011-09-24 NOTE — Progress Notes (Addendum)
Patient is eating, voiding.  Denies contraction pain, good FM.  No bleeding.  +HA  Filed Vitals:   09/24/11 1610 09/24/11 1035 09/24/11 1124 09/24/11 1146  BP:      Pulse:      Temp:      TempSrc:      Resp: 20 20 20 20   Height:      Weight:      SpO2:        Abd; NT Ext: NT  Lab Results  Component Value Date   WBC 7.9 09/23/2011   HGB 11.1* 09/23/2011   HCT 34.1* 09/23/2011   MCV 86.5 09/23/2011   PLT 183 09/23/2011       A/P 31.6 with preterm labor / GHTN Discontinue magnesium sulfate Start procardia XL 30 mg qd Discontinue PCN, if begins contracting again, will consider restart NST q shift Cont. Toco. [place]: EFW 83%, repeat scan 4 weeks Mild range BPs > 6 hrs apart, will check labs in am.  Urine 24 prot/cr cl today. Philip Aspen

## 2011-09-25 ENCOUNTER — Encounter (HOSPITAL_COMMUNITY): Payer: Self-pay | Admitting: *Deleted

## 2011-09-25 LAB — COMPREHENSIVE METABOLIC PANEL
ALT: 12 U/L (ref 0–35)
AST: 15 U/L (ref 0–37)
Albumin: 2.6 g/dL — ABNORMAL LOW (ref 3.5–5.2)
Alkaline Phosphatase: 91 U/L (ref 39–117)
BUN: 4 mg/dL — ABNORMAL LOW (ref 6–23)
CO2: 21 mEq/L (ref 19–32)
Calcium: 9.3 mg/dL (ref 8.4–10.5)
Chloride: 107 mEq/L (ref 96–112)
Creatinine, Ser: 0.52 mg/dL (ref 0.50–1.10)
GFR calc Af Amer: >90 mL/min (ref >90)
GFR calc non Af Amer: >90 mL/min (ref >90)
Glucose, Bld: 139 mg/dL — ABNORMAL HIGH (ref 70–99)
Potassium: 4.1 mEq/L (ref 3.5–5.1)
Sodium: 139 mEq/L (ref 135–145)
Total Bilirubin: 0.1 mg/dL — ABNORMAL LOW (ref 0.3–1.2)
Total Protein: 5.6 g/dL — ABNORMAL LOW (ref 6.0–8.3)

## 2011-09-25 LAB — CREATININE CLEARANCE, URINE, 24 HOUR
Collection Interval-CRCL: 24 hours
Creatinine Clearance: 154 mL/min — ABNORMAL HIGH (ref 75–115)
Creatinine, 24H Ur: 1462 mg/d (ref 700–1800)
Creatinine, Urine: 29.09 mg/dL
Creatinine: 0.66 mg/dL (ref 0.50–1.10)
Urine Total Volume-CRCL: 5025 mL

## 2011-09-25 LAB — CBC
HCT: 33.3 % — ABNORMAL LOW (ref 36.0–46.0)
Hemoglobin: 10.8 g/dL — ABNORMAL LOW (ref 12.0–15.0)
MCH: 28.4 pg (ref 26.0–34.0)
MCHC: 32.4 g/dL (ref 30.0–36.0)
MCV: 87.6 fL (ref 78.0–100.0)
Platelets: 210 10*3/uL (ref 150–400)
RBC: 3.8 MIL/uL — ABNORMAL LOW (ref 3.87–5.11)
RDW: 14.2 % (ref 11.5–15.5)
WBC: 12.5 10*3/uL — ABNORMAL HIGH (ref 4.0–10.5)

## 2011-09-25 MED ORDER — IBUPROFEN 600 MG PO TABS: 600.0000 mg | ORAL_TABLET | Freq: Four times a day (QID) | ORAL | Status: DC | PRN

## 2011-09-25 MED ORDER — ONDANSETRON HCL 4 MG/2ML IJ SOLN
4.0000 mg | Freq: Once | INTRAMUSCULAR | Status: AC
  Administered 2011-09-25: 4 mg via INTRAVENOUS
  Filled 2011-09-25: qty 2

## 2011-09-25 NOTE — Discharge Summary (Signed)
  Final Diagnosis:  Preterm labor Procedures: Hospital observation and tocolysis Condition on discharge: Stable  Hospital course:  Pt presented with preterm contractions, +FFN, possible cervical change (finger tip to 1 cm).  Admitted for 24 hours of magnesium therapy and observed for total of 48 hours without any cervical change or persistent contractions.   Discharge:  Regular diet, encouraged hydration.          Limit activity and stay out of work until follow up with Dr. Aldona Bar in 1 week.          Resume home meds.  No tocolytic's          Follow up with Dr. Aldona Bar in 1 week. Labs:  PIH labs all normal, 24 hr. Protein to be completed prior to discharge this afternoon.

## 2011-09-25 NOTE — Plan of Care (Signed)
Problem: Consults Goal: Birthing Suites Patient Information Press F2 to bring up selections list   Pt < [redacted] weeks EGA     

## 2011-09-25 NOTE — Progress Notes (Signed)
S:  No consistent UCs, headache. O:  BPs normal, Labs all normal, 24 hour urine pending.  No UCs noted on tracing.  FHT reactive.  Cervix: I could not feel internal os but cervix was < 30% effaced. A:  Positive FFN but no sign of preterm labor at this time. P:  D/C home on bedrest.  Follow up with Dr. Aldona Bar 12/3. Call for UCs.  No oral tocolytic's perscribed.

## 2011-09-26 LAB — PROTEIN, URINE, 24 HOUR
Collection Interval-UPROT: 24 hours
Protein, 24H Urine: 151 mg/d — ABNORMAL HIGH (ref 50–100)
Protein, Urine: 3 mg/dL
Urine Total Volume-UPROT: 5025 mL

## 2011-09-28 LAB — CULTURE, BETA STREP (GROUP B ONLY)

## 2011-10-17 ENCOUNTER — Inpatient Hospital Stay (HOSPITAL_COMMUNITY)
Admission: AD | Admit: 2011-10-17 | Discharge: 2011-10-17 | Disposition: A | Discharge status: Home / Self Care | Payer: PRIVATE HEALTH INSURANCE | Source: Ambulatory Visit | Attending: Obstetrics and Gynecology | Admitting: Obstetrics and Gynecology

## 2011-10-17 ENCOUNTER — Encounter (HOSPITAL_COMMUNITY): Payer: Self-pay | Admitting: *Deleted

## 2011-10-17 DIAGNOSIS — O4700 False labor before 37 completed weeks of gestation, unspecified trimester: Secondary | ICD-10-CM | POA: Insufficient documentation

## 2011-10-17 DIAGNOSIS — M549 Dorsalgia, unspecified: Secondary | ICD-10-CM | POA: Insufficient documentation

## 2011-10-17 DIAGNOSIS — R030 Elevated blood-pressure reading, without diagnosis of hypertension: Secondary | ICD-10-CM | POA: Insufficient documentation

## 2011-10-17 LAB — COMPREHENSIVE METABOLIC PANEL
ALT: 16 U/L (ref 0–35)
AST: 23 U/L (ref 0–37)
Albumin: 2.7 g/dL — ABNORMAL LOW (ref 3.5–5.2)
Alkaline Phosphatase: 142 U/L — ABNORMAL HIGH (ref 39–117)
BUN: 5 mg/dL — ABNORMAL LOW (ref 6–23)
CO2: 19 mEq/L (ref 19–32)
Calcium: 9.2 mg/dL (ref 8.4–10.5)
Chloride: 105 mEq/L (ref 96–112)
Creatinine, Ser: 0.46 mg/dL — ABNORMAL LOW (ref 0.50–1.10)
GFR calc Af Amer: >90 mL/min (ref >90)
GFR calc non Af Amer: >90 mL/min (ref >90)
Glucose, Bld: 97 mg/dL (ref 70–99)
Potassium: 3.5 mEq/L (ref 3.5–5.1)
Sodium: 137 mEq/L (ref 135–145)
Total Bilirubin: 0.2 mg/dL — ABNORMAL LOW (ref 0.3–1.2)
Total Protein: 6.1 g/dL (ref 6.0–8.3)

## 2011-10-17 LAB — CBC
HCT: 37.5 % (ref 36.0–46.0)
Hemoglobin: 12.3 g/dL (ref 12.0–15.0)
MCH: 27.8 pg (ref 26.0–34.0)
MCHC: 32.8 g/dL (ref 30.0–36.0)
MCV: 84.7 fL (ref 78.0–100.0)
Platelets: 222 10*3/uL (ref 150–400)
RBC: 4.43 MIL/uL (ref 3.87–5.11)
RDW: 14.4 % (ref 11.5–15.5)
WBC: 13.1 10*3/uL — ABNORMAL HIGH (ref 4.0–10.5)

## 2011-10-17 LAB — URINALYSIS, ROUTINE W REFLEX MICROSCOPIC
Bilirubin Urine: NEGATIVE
Glucose, UA: NEGATIVE mg/dL
Hgb urine dipstick: NEGATIVE
Ketones, ur: 15 mg/dL — AB
Nitrite: NEGATIVE
Protein, ur: NEGATIVE mg/dL
Specific Gravity, Urine: 1.02 (ref 1.005–1.030)
Urobilinogen, UA: 0.2 mg/dL (ref 0.0–1.0)
pH: 6 (ref 5.0–8.0)

## 2011-10-17 LAB — URIC ACID: Uric Acid, Serum: 4.7 mg/dL (ref 2.4–7.0)

## 2011-10-17 LAB — LACTATE DEHYDROGENASE: LDH: 157 U/L (ref 94–250)

## 2011-10-17 LAB — URINE MICROSCOPIC-ADD ON

## 2011-10-17 MED ORDER — ACETAMINOPHEN 500 MG PO TABS
1000.0000 mg | ORAL_TABLET | Freq: Once | ORAL | Status: AC
  Administered 2011-10-17: 1000 mg via ORAL
  Filled 2011-10-17: qty 2

## 2011-10-17 MED ORDER — TERBUTALINE SULFATE 1 MG/ML IJ SOLN
0.2500 mg | Freq: Once | INTRAMUSCULAR | Status: AC
  Administered 2011-10-17: 0.25 mg via SUBCUTANEOUS
  Filled 2011-10-17: qty 1

## 2011-10-17 NOTE — Progress Notes (Signed)
Pt reports contractions and pressure since 4 am. SVE yesterday 4 cm

## 2011-10-17 NOTE — ED Provider Notes (Signed)
History     Chief Complaint  Patient presents with  . Labor Eval   HPI 25 y.o. G1P0000 at [redacted]w[redacted]d c/o contractions since 4 am, ongoing back pain, worse with contractions, no bleeding or LOF. + fetal movement. 3-4 cm in office yesterday.     Past Medical History  Diagnosis Date  . Hyperthyroidism   . Kidney stones   . Depression     pt was 25 yo-no longer medicated  . Asthma   . Migraines     Past Surgical History  Procedure Date  . Cholecystectomy   . Appendectomy   . Parathyroidectomy 2010  . Lithotripsy     History reviewed. No pertinent family history.  History  Substance Use Topics  . Smoking status: Never Smoker   . Smokeless tobacco: Not on file  . Alcohol Use: No    Allergies:  Allergies  Allergen Reactions  . Aspirin Anaphylaxis    Prescriptions prior to admission  Medication Sig Dispense Refill  . butalbital-acetaminophen-caffeine (FIORICET) 50-325-40 MG per tablet Take 1 tablet by mouth every 6 (six) hours as needed for headache.  10 tablet  0  . cyclobenzaprine (FLEXERIL) 5 MG tablet Take 5 mg by mouth daily as needed. For headache       . diphenhydramine-acetaminophen (TYLENOL PM) 25-500 MG TABS Take 1 tablet by mouth at bedtime as needed.        . Famotidine (PEPCID PO) Take 1 tablet by mouth daily as needed. Patient takes medication for heartburn.       Marland Kitchen omeprazole (PRILOSEC) 10 MG capsule Take 10 mg by mouth daily.        Marland Kitchen oxycodone-acetaminophen (ROXICET) 5-500 MG per tablet Take 0.5 tablets by mouth every 6 (six) hours as needed. For pain       . prenatal vitamin w/FE, FA (PRENATAL 1 + 1) 27-1 MG TABS Take 1 tablet by mouth daily.        . promethazine (PHENERGAN) 25 MG tablet Take 25 mg by mouth as needed. For nausea         Review of Systems  Constitutional: Negative.   Respiratory: Negative.   Cardiovascular: Negative.   Gastrointestinal: Negative for nausea, vomiting, abdominal pain, diarrhea and constipation.  Genitourinary: Negative  for dysuria, urgency, frequency, hematuria and flank pain.       Negative for vaginal bleeding, Positive for contractions  Musculoskeletal: Positive for back pain.  Neurological: Negative.   Psychiatric/Behavioral: Negative.    Physical Exam   Blood pressure 144/89, pulse 102, temperature 98.9 F (37.2 C), resp. rate 20, height 5\' 6"  (1.676 m), weight 255 lb (115.667 kg), last menstrual period 01/29/2011, SpO2 98.00%.  Physical Exam  Nursing note and vitals reviewed. Constitutional: She is oriented to person, place, and time. She appears well-developed and well-nourished. She appears distressed.  Cardiovascular: Normal rate.   Respiratory: Effort normal.  GI: Soft. There is no tenderness.  Genitourinary:       SVE: 3-4/50/-2  Musculoskeletal: Normal range of motion.  Neurological: She is alert and oriented to person, place, and time. No cranial nerve deficit.  Skin: Skin is warm and dry.  Psychiatric: She has a normal mood and affect.   EFM: 135, moderate variability, + accels, no decels, Toco: q 2-4 minutes MAU Course  Procedures  Results for orders placed during the hospital encounter of 10/17/11 (from the past 24 hour(s))  URINALYSIS, ROUTINE W REFLEX MICROSCOPIC     Status: Abnormal   Collection Time  10/17/11  6:31 AM      Component Value Range   Color, Urine YELLOW  YELLOW    APPearance CLEAR  CLEAR    Specific Gravity, Urine 1.020  1.005 - 1.030    pH 6.0  5.0 - 8.0    Glucose, UA NEGATIVE  NEGATIVE (mg/dL)   Hgb urine dipstick NEGATIVE  NEGATIVE    Bilirubin Urine NEGATIVE  NEGATIVE    Ketones, ur 15 (*) NEGATIVE (mg/dL)   Protein, ur NEGATIVE  NEGATIVE (mg/dL)   Urobilinogen, UA 0.2  0.0 - 1.0 (mg/dL)   Nitrite NEGATIVE  NEGATIVE    Leukocytes, UA SMALL (*) NEGATIVE   URINE MICROSCOPIC-ADD ON     Status: Abnormal   Collection Time   10/17/11  6:31 AM      Component Value Range   Squamous Epithelial / LPF MANY (*) RARE    WBC, UA 7-10  <3 (WBC/hpf)    Bacteria, UA RARE  RARE      Assessment and Plan  25 y.o. G1P0000 at [redacted]w[redacted]d Contractions and back pain Dr. Henderson Cloud consulted - Tylenol for back pain, reassess cervix in 1 hour Elevated BP - PIH labs ordered Report to be called to Dr. Dareen Piano after next cervical exam  FRAZIER,NATALIE 10/17/2011, 7:12 AM

## 2011-10-22 ENCOUNTER — Encounter (HOSPITAL_COMMUNITY): Payer: Self-pay | Admitting: Anesthesiology

## 2011-10-22 ENCOUNTER — Encounter (HOSPITAL_COMMUNITY): Payer: Self-pay | Admitting: *Deleted

## 2011-10-22 ENCOUNTER — Inpatient Hospital Stay (HOSPITAL_COMMUNITY)
Admission: AD | Admit: 2011-10-22 | Discharge: 2011-10-25 | DRG: 775 | Disposition: A | Discharge status: Home / Self Care | Payer: PRIVATE HEALTH INSURANCE | Source: Ambulatory Visit | Attending: Obstetrics and Gynecology | Admitting: Obstetrics and Gynecology

## 2011-10-22 ENCOUNTER — Inpatient Hospital Stay (HOSPITAL_COMMUNITY): Payer: PRIVATE HEALTH INSURANCE | Admitting: Anesthesiology

## 2011-10-22 HISTORY — DX: Other complications of anesthesia, initial encounter: T88.59XA

## 2011-10-22 HISTORY — DX: Adverse effect of unspecified anesthetic, initial encounter: T41.45XA

## 2011-10-22 LAB — CBC
HCT: 35.2 % — ABNORMAL LOW (ref 36.0–46.0)
Hemoglobin: 11.8 g/dL — ABNORMAL LOW (ref 12.0–15.0)
MCH: 28.2 pg (ref 26.0–34.0)
MCHC: 33.5 g/dL (ref 30.0–36.0)
MCV: 84.2 fL (ref 78.0–100.0)
Platelets: 229 10*3/uL (ref 150–400)
RBC: 4.18 MIL/uL (ref 3.87–5.11)
RDW: 14.6 % (ref 11.5–15.5)
WBC: 12 10*3/uL — ABNORMAL HIGH (ref 4.0–10.5)

## 2011-10-22 LAB — URINALYSIS, ROUTINE W REFLEX MICROSCOPIC
Bilirubin Urine: NEGATIVE
Glucose, UA: NEGATIVE mg/dL
Hgb urine dipstick: NEGATIVE
Ketones, ur: 15 mg/dL — AB
Leukocytes, UA: NEGATIVE
Nitrite: NEGATIVE
Protein, ur: NEGATIVE mg/dL
Specific Gravity, Urine: 1.02 (ref 1.005–1.030)
Urobilinogen, UA: 0.2 mg/dL (ref 0.0–1.0)
pH: 6 (ref 5.0–8.0)

## 2011-10-22 MED ORDER — ACETAMINOPHEN 325 MG PO TABS
650.0000 mg | ORAL_TABLET | ORAL | Status: DC | PRN
  Administered 2011-10-22: 650 mg via ORAL
  Filled 2011-10-22: qty 2

## 2011-10-22 MED ORDER — LACTATED RINGERS IV SOLN
INTRAVENOUS | Status: DC
  Administered 2011-10-22 (×2): via INTRAVENOUS

## 2011-10-22 MED ORDER — TERBUTALINE SULFATE 1 MG/ML IJ SOLN: 0.2500 mg | Freq: Once | INTRAMUSCULAR | Status: AC | PRN

## 2011-10-22 MED ORDER — OXYTOCIN 20 UNITS IN LACTATED RINGERS INFUSION - SIMPLE: 125.0000 mL/h | Freq: Once | INTRAVENOUS | Status: DC

## 2011-10-22 MED ORDER — OXYTOCIN BOLUS FROM INFUSION
500.0000 mL | Freq: Once | INTRAVENOUS | Status: DC
  Filled 2011-10-22: qty 500

## 2011-10-22 MED ORDER — LIDOCAINE HCL (PF) 1 % IJ SOLN
30.0000 mL | INTRAMUSCULAR | Status: DC | PRN
  Filled 2011-10-22: qty 30

## 2011-10-22 MED ORDER — EPHEDRINE 5 MG/ML INJ
10.0000 mg | INTRAVENOUS | Status: DC | PRN
  Filled 2011-10-22: qty 4

## 2011-10-22 MED ORDER — OXYTOCIN 20 UNITS IN LACTATED RINGERS INFUSION - SIMPLE
1.0000 m[IU]/min | INTRAVENOUS | Status: DC
  Administered 2011-10-22: 1 m[IU]/min via INTRAVENOUS
  Filled 2011-10-22: qty 1000

## 2011-10-22 MED ORDER — OXYCODONE-ACETAMINOPHEN 5-325 MG PO TABS: 2.0000 | ORAL_TABLET | ORAL | Status: DC | PRN

## 2011-10-22 MED ORDER — PENICILLIN G POTASSIUM 5000000 UNITS IJ SOLR
2.5000 10*6.[IU] | INTRAVENOUS | Status: DC
  Administered 2011-10-22 (×2): 2.5 10*6.[IU] via INTRAVENOUS
  Filled 2011-10-22 (×7): qty 2.5

## 2011-10-22 MED ORDER — LIDOCAINE HCL 1.5 % IJ SOLN
INTRAMUSCULAR | Status: DC | PRN
  Administered 2011-10-22 (×2): 5 mL via EPIDURAL

## 2011-10-22 MED ORDER — FENTANYL 2.5 MCG/ML BUPIVACAINE 1/10 % EPIDURAL INFUSION (WH - ANES)
INTRAMUSCULAR | Status: DC | PRN
  Administered 2011-10-22: 14 mL/h via EPIDURAL

## 2011-10-22 MED ORDER — ONDANSETRON HCL 4 MG/2ML IJ SOLN
4.0000 mg | Freq: Four times a day (QID) | INTRAMUSCULAR | Status: DC | PRN
  Administered 2011-10-22: 4 mg via INTRAVENOUS
  Filled 2011-10-22: qty 2

## 2011-10-22 MED ORDER — CITRIC ACID-SODIUM CITRATE 334-500 MG/5ML PO SOLN: 30.0000 mL | ORAL | Status: DC | PRN

## 2011-10-22 MED ORDER — LACTATED RINGERS IV SOLN: 500.0000 mL | Freq: Once | INTRAVENOUS | Status: DC

## 2011-10-22 MED ORDER — PROMETHAZINE HCL 25 MG/ML IJ SOLN
25.0000 mg | Freq: Once | INTRAMUSCULAR | Status: AC
  Administered 2011-10-22: 25 mg via INTRAMUSCULAR
  Filled 2011-10-22: qty 1

## 2011-10-22 MED ORDER — FLEET ENEMA 7-19 GM/118ML RE ENEM: 1.0000 | ENEMA | RECTAL | Status: DC | PRN

## 2011-10-22 MED ORDER — PENICILLIN G POTASSIUM 5000000 UNITS IJ SOLR
5.0000 10*6.[IU] | Freq: Once | INTRAVENOUS | Status: AC
  Administered 2011-10-22: 5 10*6.[IU] via INTRAVENOUS
  Filled 2011-10-22: qty 5

## 2011-10-22 MED ORDER — EPHEDRINE 5 MG/ML INJ: 10.0000 mg | INTRAVENOUS | Status: DC | PRN

## 2011-10-22 MED ORDER — PHENYLEPHRINE 40 MCG/ML (10ML) SYRINGE FOR IV PUSH (FOR BLOOD PRESSURE SUPPORT): 80.0000 ug | PREFILLED_SYRINGE | INTRAVENOUS | Status: DC | PRN

## 2011-10-22 MED ORDER — LACTATED RINGERS IV SOLN
500.0000 mL | INTRAVENOUS | Status: DC | PRN
  Administered 2011-10-22: 1000 mL via INTRAVENOUS
  Administered 2011-10-22 (×2): 500 mL via INTRAVENOUS

## 2011-10-22 MED ORDER — PHENYLEPHRINE 40 MCG/ML (10ML) SYRINGE FOR IV PUSH (FOR BLOOD PRESSURE SUPPORT)
80.0000 ug | PREFILLED_SYRINGE | INTRAVENOUS | Status: DC | PRN
  Filled 2011-10-22: qty 5

## 2011-10-22 MED ORDER — DIPHENHYDRAMINE HCL 50 MG/ML IJ SOLN: 12.5000 mg | INTRAMUSCULAR | Status: DC | PRN

## 2011-10-22 MED ORDER — FENTANYL 2.5 MCG/ML BUPIVACAINE 1/10 % EPIDURAL INFUSION (WH - ANES)
14.0000 mL/h | INTRAMUSCULAR | Status: DC
  Administered 2011-10-22 (×2): 14 mL/h via EPIDURAL
  Filled 2011-10-22 (×3): qty 60

## 2011-10-22 NOTE — ED Notes (Signed)
No adverse effect from im phenergan, pt feels some nausea relief.

## 2011-10-22 NOTE — Anesthesia Procedure Notes (Addendum)
Epidural Patient location during procedure: holding area Start time: 10/22/2011 6:32 PM End time: 10/22/2011 6:36 PM Reason for block: procedure for pain  Staffing Anesthesiologist: Sandrea Hughs Performed by: anesthesiologist   Preanesthetic Checklist Completed: patient identified, site marked, surgical consent, pre-op evaluation, timeout performed, IV checked, risks and benefits discussed and monitors and equipment checked  Epidural Patient position: sitting Prep: site prepped and draped and DuraPrep Patient monitoring: continuous pulse ox and blood pressure Approach: midline Injection technique: LOR air  Needle:  Needle type: Tuohy  Needle gauge: 17 G Needle length: 9 cm Needle insertion depth: 6 cm Catheter type: closed end flexible Catheter size: 19 Gauge Catheter at skin depth: 11 cm Test dose: negative and 1.5% lidocaine  Assessment Sensory level: T8 Events: blood not aspirated, injection not painful, no injection resistance, negative IV test and no paresthesia

## 2011-10-22 NOTE — H&P (Signed)
Pt is a 25 year old white female G1 who is admitted in active labor. PNC was complicated by PTL. Pt was 5cm on admission and made change while in the ER PMHx:see [place] PE: Obese white female in mod distress HEENT-wnl Abd- gravid, palp contraction Cx80/5/-2 vtx AROM- clear FHTs- reactive. IMP/ IUP with preterm labor Plan/ Admit

## 2011-10-22 NOTE — Progress Notes (Signed)
Dr Dareen Piano called unit to check on pt status. Updated on contraction pattern. Order to start Pitocin now.

## 2011-10-22 NOTE — Progress Notes (Signed)
Dr Dareen Piano called unit to check on pt status. Updated on contraction pattern, FHR and temp. Ordered to give Tylenol 650 PO now.

## 2011-10-22 NOTE — Anesthesia Preprocedure Evaluation (Signed)
Anesthesia Evaluation  Patient identified by MRN, date of birth, ID band Patient awake    Reviewed: Allergy & Precautions, H&P , NPO status , Patient's Chart, lab work & pertinent test results  Airway Mallampati: II TM Distance: >3 FB Neck ROM: full    Dental No notable dental hx.    Pulmonary    Pulmonary exam normal       Cardiovascular neg cardio ROS     Neuro/Psych PSYCHIATRIC DISORDERS Depression    GI/Hepatic negative GI ROS, Neg liver ROS,   Endo/Other  Morbid obesity  Renal/GU negative Renal ROS  Genitourinary negative   Musculoskeletal negative musculoskeletal ROS (+)   Abdominal (+) obese,   Peds negative pediatric ROS (+)  Hematology negative hematology ROS (+)   Anesthesia Other Findings   Reproductive/Obstetrics (+) Pregnancy                           Anesthesia Physical Anesthesia Plan  ASA: III  Anesthesia Plan: Spinal   Post-op Pain Management:    Induction:   Airway Management Planned:   Additional Equipment:   Intra-op Plan:   Post-operative Plan:   Informed Consent: I have reviewed the patients History and Physical, chart, labs and discussed the procedure including the risks, benefits and alternatives for the proposed anesthesia with the patient or authorized representative who has indicated his/her understanding and acceptance.     Plan Discussed with:   Anesthesia Plan Comments:         Anesthesia Quick Evaluation

## 2011-10-22 NOTE — Plan of Care (Signed)
Dr. Dareen Piano notified of pt's cervical exam, efm tracing reactive, order for pt to walk x1hour or d/c home.

## 2011-10-22 NOTE — Progress Notes (Signed)
Pt states she has contractions that are coming and going

## 2011-10-23 ENCOUNTER — Encounter (HOSPITAL_COMMUNITY): Payer: Self-pay | Admitting: *Deleted

## 2011-10-23 LAB — CBC
HCT: 34.1 % — ABNORMAL LOW (ref 36.0–46.0)
Hemoglobin: 11.3 g/dL — ABNORMAL LOW (ref 12.0–15.0)
MCH: 28 pg (ref 26.0–34.0)
MCHC: 33.1 g/dL (ref 30.0–36.0)
MCV: 84.6 fL (ref 78.0–100.0)
Platelets: 183 10*3/uL (ref 150–400)
RBC: 4.03 MIL/uL (ref 3.87–5.11)
RDW: 14.5 % (ref 11.5–15.5)
WBC: 13.5 10*3/uL — ABNORMAL HIGH (ref 4.0–10.5)

## 2011-10-23 LAB — RPR: RPR Ser Ql: NONREACTIVE

## 2011-10-23 LAB — ABO/RH: ABO/RH(D): O POS

## 2011-10-23 MED ORDER — IBUPROFEN 600 MG PO TABS
600.0000 mg | ORAL_TABLET | Freq: Four times a day (QID) | ORAL | Status: DC
  Administered 2011-10-23 – 2011-10-25 (×9): 600 mg via ORAL
  Filled 2011-10-23 (×9): qty 1

## 2011-10-23 MED ORDER — OXYCODONE-ACETAMINOPHEN 5-325 MG PO TABS
1.0000 | ORAL_TABLET | ORAL | Status: DC | PRN
  Administered 2011-10-23 (×2): 1 via ORAL
  Filled 2011-10-23 (×2): qty 1

## 2011-10-23 MED ORDER — BENZOCAINE-MENTHOL 20-0.5 % EX AERO
INHALATION_SPRAY | CUTANEOUS | Status: AC
  Filled 2011-10-23: qty 56

## 2011-10-23 MED ORDER — MEASLES, MUMPS & RUBELLA VAC ~~LOC~~ INJ
0.5000 mL | INJECTION | Freq: Once | SUBCUTANEOUS | Status: DC
  Filled 2011-10-23: qty 0.5

## 2011-10-23 MED ORDER — SIMETHICONE 80 MG PO CHEW: 80.0000 mg | CHEWABLE_TABLET | ORAL | Status: DC | PRN

## 2011-10-23 MED ORDER — WITCH HAZEL-GLYCERIN EX PADS: 1.0000 "application " | MEDICATED_PAD | CUTANEOUS | Status: DC | PRN

## 2011-10-23 MED ORDER — ZOLPIDEM TARTRATE 5 MG PO TABS: 5.0000 mg | ORAL_TABLET | Freq: Every evening | ORAL | Status: DC | PRN

## 2011-10-23 MED ORDER — ONDANSETRON HCL 4 MG/2ML IJ SOLN: 4.0000 mg | INTRAMUSCULAR | Status: DC | PRN

## 2011-10-23 MED ORDER — DIBUCAINE 1 % RE OINT: 1.0000 "application " | TOPICAL_OINTMENT | RECTAL | Status: DC | PRN

## 2011-10-23 MED ORDER — ONDANSETRON HCL 4 MG PO TABS: 4.0000 mg | ORAL_TABLET | ORAL | Status: DC | PRN

## 2011-10-23 MED ORDER — BENZOCAINE-MENTHOL 20-0.5 % EX AERO
1.0000 "application " | INHALATION_SPRAY | CUTANEOUS | Status: DC | PRN
  Administered 2011-10-23: 1 via TOPICAL

## 2011-10-23 MED ORDER — TETANUS-DIPHTH-ACELL PERTUSSIS 5-2.5-18.5 LF-MCG/0.5 IM SUSP
0.5000 mL | Freq: Once | INTRAMUSCULAR | Status: AC
  Administered 2011-10-24: 0.5 mL via INTRAMUSCULAR
  Filled 2011-10-23: qty 0.5

## 2011-10-23 NOTE — Anesthesia Postprocedure Evaluation (Signed)
  Anesthesia Post-op Note  Patient: Anita Beck  Procedure(s) Performed: * No procedures listed *  Patient Location: Mother/Baby  Anesthesia Type: Epidural  Level of Consciousness: awake  Airway and Oxygen Therapy: Patient Spontanous Breathing  Post-op Pain: none  Post-op Assessment: Patient's Cardiovascular Status Stable and Respiratory Function Stable  Post-op Vital Signs: Reviewed and stable  Complications: No apparent anesthesia complications

## 2011-10-23 NOTE — Progress Notes (Signed)
Post Partum Day 1/2 Subjective: no complaints  Objective: Blood pressure 116/73, pulse 85, temperature 98 F.  Breastfeeding.  Physical Exam:  General: alert Lochia: appropriate Uterine Fundus: firm   Basename 10/23/11 0510 10/22/11 1255  HGB 11.3* 11.8*  HCT 34.1* 35.2*    Assessment/Plan: Doing well, continue post partum observation   LOS: 1 day   Breannah Kratt D 10/23/2011, 10:43 AM

## 2011-10-24 NOTE — Progress Notes (Addendum)
Post Partum Day 1 Subjective: no complaints, up ad lib, voiding and tolerating PO  Objective: Blood pressure 134/88, pulse 74, temperature 98.3 F (36.8 C), temperature source Oral, resp. rate 20, height 5\' 6"  (1.676 m), weight 115.667 kg (255 lb), last menstrual period 01/29/2011, SpO2 98.00%, unknown if currently breastfeeding.  Physical Exam:  General: alert, cooperative and appears stated age Lochia: appropriate Uterine Fundus: firm   Basename 10/23/11 0510 10/22/11 1255  HGB 11.3* 11.8*  HCT 34.1* 35.2*    Assessment/Plan: Routine postpartum care Will work with Lactation   LOS: 2 days   Anita Beck H. 10/24/2011, 9:04 AM

## 2011-10-24 NOTE — Progress Notes (Signed)
PSYCHOSOCIAL ASSESSMENT ~ MATERNAL/CHILD Name:Anita Beck  Baby: Anita Beck    Age: 25  Referral Date 10/24/11     Reason/Source: CN-hx of depression  I. FAMILY/HOME ENVIRONMENT A. Child's Legal Guardian _X__Parent(s) ___Grandparent ___Foster parent ___DSS_________________ Name: Anita Beck    DOB: [birth year]  Age   25 Address: [address] [place], [place] [address] Name: [name]   Address: Same as above B. Other Household Members/Support Persons Name_None____________________Relationship____________ DOB ___/___/___ Name_____________________Relationship____________ DOB ___/___/___ Name_____________________Relationship____________ DOB ___/___/___ Name_____________________Relationship____________ DOB ___/___/___ C. Other Support: Friends and family II. PSYCHOSOCIAL DATA A. Information Source _X_Patient Interview _X_Family Interview __Other___________ B. Public house manager : Both parents employed __Medicaid County__________  _X_Private Insurance: [employer] __Self Pay  __Food Stamps  __WIC  __Work First  __Public Housing  __Section 8  __Maternity Care Coordination/Child Service Coordination/Early Intervention _________________________________________________________________School _____________________________________Grade____________ _X_Other: Already has pediatrician C. Cultural and Environment Information Cultural Issues Impacting Care: None reported III. STRENGTHS _X__Supportive family/friends _X__Adequate Resources _X__Compliance with medical plan _X__Home prepared for Child (including basic supplies) ___Understanding of illness  ___Other__________________________________________________________ IV. RISK FACTORS AND CURRENT PROBLEMS   Mental Illness: Stated in chart that patient had a history of depression. Pt denied any depression currently or in past  V. SOCIAL WORK ASSESSMENT  CSW received referral due to patient having a hx of  depression. CSW met with MOB and FOB at bedside. FOB was holding baby and appeared to be bonding well. CSW observed car seat, diapers, clothes, blankets, etc in room. MOB reported several resources and stated she was going to donate extra clothes and diapers she received. CSW received permission to speak with MOB in front of FOB. CSW spoke with MOB regarding any history of depression or anxiety. MOB reported no current or history of depression or anxiety. CSW spoke with MOB regarding the difference between the baby blues and postpardum depression. CSW provided family with "Feelings After Birth" brochure. MOB stated she felt comfortable discussing any symptoms with her OBGYN. MOB reported no further concerns. CSW is signing off but can be consulted if needed.    VI. SOCIAL WORK PLAN __X_No Further Intervention Required/No Barriers to Discharge  ___Psychosocial Support and Ongoing Assessment of Needs  _X__Patient/Family Education: "Feelings After Birth" brochure ___Child Protective Services Report County___________ Date___/____/____  ___Information/Referral to [employer]_________________________  ___Other__________________________________________________________

## 2011-10-25 NOTE — Discharge Summary (Signed)
Obstetric Discharge Summary Reason for Admission: onset of labor Prenatal Procedures: none Intrapartum Procedures: spontaneous vaginal delivery Postpartum Procedures: none Complications-Operative and Postpartum: none Hemoglobin  Date Value Range Status  10/23/2011 11.3* 12.0-15.0 (g/dL) Final     HCT  Date Value Range Status  10/23/2011 34.1* 36.0-46.0 (%) Final    Discharge Diagnoses: Term Pregnancy-delivered  Discharge Information: Date: 10/25/2011 Activity: unrestricted Diet: routine Medications: PNV and Ibuprofen Condition: stable Instructions: refer to practice specific booklet Discharge to: home Follow-up Information    Follow up with ANDERSON,MARK E in 4 weeks.   Contact information:   [address] [place] [address] [phone]          Newborn Data: Live born female  Birth Weight: 7 lb 6.3 oz (3355 g) APGAR: 8, 9  Home with mother.  Philip Aspen 10/25/2011, 9:48 AM

## 2012-10-29 NOTE — L&D Delivery Note (Signed)
Delivery Note At 10:04 AM a viable female was delivered via Vaginal, Spontaneous Delivery (PresentationOA: ;  ).  APGAR: , ; weight .   Placenta status:spont>>intact , .  Cord:  with the following complications: .  Cord pH: not sent  Anesthesia: Epidural  Episiotomy: none Lacerations: none Suture Repair: n/a Est. Blood Loss (mL): 300  Mom to postpartum.  Baby to nursery-stable.  Ryleigh Buenger M 05/28/2013, 10:12 AM

## 2012-11-05 LAB — OB RESULTS CONSOLE HIV ANTIBODY (ROUTINE TESTING): HIV: NONREACTIVE

## 2012-11-05 LAB — OB RESULTS CONSOLE RPR: RPR: NONREACTIVE

## 2012-11-20 LAB — OB RESULTS CONSOLE GC/CHLAMYDIA
Chlamydia: NEGATIVE
Gonorrhea: NEGATIVE

## 2013-01-16 ENCOUNTER — Inpatient Hospital Stay (HOSPITAL_COMMUNITY)
Admission: AD | Admit: 2013-01-16 | Discharge: 2013-01-16 | Disposition: A | Discharge status: Home / Self Care | Payer: BC Managed Care – PPO | Source: Ambulatory Visit | Attending: Obstetrics and Gynecology | Admitting: Obstetrics and Gynecology

## 2013-01-16 ENCOUNTER — Encounter (HOSPITAL_COMMUNITY): Payer: Self-pay | Admitting: *Deleted

## 2013-01-16 DIAGNOSIS — A088 Other specified intestinal infections: Secondary | ICD-10-CM | POA: Insufficient documentation

## 2013-01-16 DIAGNOSIS — O99891 Other specified diseases and conditions complicating pregnancy: Secondary | ICD-10-CM | POA: Insufficient documentation

## 2013-01-16 DIAGNOSIS — R109 Unspecified abdominal pain: Secondary | ICD-10-CM | POA: Insufficient documentation

## 2013-01-16 DIAGNOSIS — A084 Viral intestinal infection, unspecified: Secondary | ICD-10-CM

## 2013-01-16 DIAGNOSIS — O210 Mild hyperemesis gravidarum: Secondary | ICD-10-CM | POA: Insufficient documentation

## 2013-01-16 LAB — CBC
HCT: 38.3 % (ref 36.0–46.0)
Hemoglobin: 12.8 g/dL (ref 12.0–15.0)
MCH: 29 pg (ref 26.0–34.0)
MCHC: 33.4 g/dL (ref 30.0–36.0)
MCV: 86.8 fL (ref 78.0–100.0)
Platelets: 203 10*3/uL (ref 150–400)
RBC: 4.41 MIL/uL (ref 3.87–5.11)
RDW: 14.1 % (ref 11.5–15.5)
WBC: 11.7 10*3/uL — ABNORMAL HIGH (ref 4.0–10.5)

## 2013-01-16 LAB — URINALYSIS, ROUTINE W REFLEX MICROSCOPIC
Glucose, UA: NEGATIVE mg/dL
Hgb urine dipstick: NEGATIVE
Ketones, ur: 15 mg/dL — AB
Nitrite: NEGATIVE
Protein, ur: 30 mg/dL — AB
Specific Gravity, Urine: 1.025 (ref 1.005–1.030)
Urobilinogen, UA: 0.2 mg/dL (ref 0.0–1.0)
pH: 6 (ref 5.0–8.0)

## 2013-01-16 LAB — COMPREHENSIVE METABOLIC PANEL
ALT: 23 U/L (ref 0–35)
AST: 36 U/L (ref 0–37)
Albumin: 3.1 g/dL — ABNORMAL LOW (ref 3.5–5.2)
Alkaline Phosphatase: 49 U/L (ref 39–117)
BUN: 10 mg/dL (ref 6–23)
CO2: 21 mEq/L (ref 19–32)
Calcium: 8.9 mg/dL (ref 8.4–10.5)
Chloride: 103 mEq/L (ref 96–112)
Creatinine, Ser: 0.5 mg/dL (ref 0.50–1.10)
GFR calc Af Amer: >90 mL/min (ref >90)
GFR calc non Af Amer: >90 mL/min (ref >90)
Glucose, Bld: 129 mg/dL — ABNORMAL HIGH (ref 70–99)
Potassium: 3.9 mEq/L (ref 3.5–5.1)
Sodium: 136 mEq/L (ref 135–145)
Total Bilirubin: 0.3 mg/dL (ref 0.3–1.2)
Total Protein: 6.5 g/dL (ref 6.0–8.3)

## 2013-01-16 LAB — URINE MICROSCOPIC-ADD ON

## 2013-01-16 MED ORDER — GI COCKTAIL ~~LOC~~
30.0000 mL | ORAL | Status: AC
  Administered 2013-01-16: 30 mL via ORAL
  Filled 2013-01-16: qty 30

## 2013-01-16 MED ORDER — PROMETHAZINE HCL 25 MG PO TABS: 12.5000 mg | ORAL_TABLET | Freq: Four times a day (QID) | ORAL | Status: DC | PRN

## 2013-01-16 MED ORDER — ONDANSETRON 8 MG PO TBDP
8.0000 mg | ORAL_TABLET | ORAL | Status: AC
  Administered 2013-01-16: 8 mg via ORAL
  Filled 2013-01-16: qty 1

## 2013-01-16 MED ORDER — PROMETHAZINE HCL 25 MG PO TABS
25.0000 mg | ORAL_TABLET | ORAL | Status: AC
  Administered 2013-01-16: 25 mg via ORAL
  Filled 2013-01-16: qty 1

## 2013-01-16 MED ORDER — METOCLOPRAMIDE HCL 10 MG PO TABS
10.0000 mg | ORAL_TABLET | Freq: Once | ORAL | Status: AC
  Administered 2013-01-16: 10 mg via ORAL
  Filled 2013-01-16: qty 1

## 2013-01-16 NOTE — MAU Note (Signed)
LLeftwich-Kirby, CNM notified pt still feeling nauseated, orders for zofran odt to be entered.

## 2013-01-16 NOTE — MAU Note (Signed)
Pt states still feels very nauseated. No adverse reaction from phenergan. States phenergan makes her feel drunk, however nausea is unrelieved.

## 2013-01-16 NOTE — MAU Provider Note (Signed)
Chief Complaint: Nausea and Emesis   None    SUBJECTIVE HPI: Anita Beck is a 27 y.o. G2P0101 at [redacted]w[redacted]d by LMP who presents to maternity admissions reporting nausea and vomiting with upper abdominal pain since noon yesterday.  She reports eating a chicken sandwich, then vomiting within an hour.  She continued to feel nauseous, then vomited ~20 times during the night, mostly just liquid or bile.  She reports good fetal movement, denies LOF, vaginal bleeding, vaginal itching/burning, urinary symptoms, h/a, dizziness, or fever/chills.     Past Medical History  Diagnosis Date  . Hyperthyroidism   . Kidney stones   . Depression     pt was 27 yo-no longer medicated  . Asthma   . Migraines   . Complication of anesthesia    Past Surgical History  Procedure Laterality Date  . Cholecystectomy    . Appendectomy    . Parathyroidectomy  2010  . Lithotripsy     History   Social History  . Marital Status: Married    Spouse Name: N/A    Number of Children: N/A  . Years of Education: N/A   Occupational History  . Not on file.   Social History Main Topics  . Smoking status: Never Smoker   . Smokeless tobacco: Not on file  . Alcohol Use: No  . Drug Use: No  . Sexually Active: Yes   Other Topics Concern  . Not on file   Social History Narrative  . No narrative on file   No current facility-administered medications on file prior to encounter.   Current Outpatient Prescriptions on File Prior to Encounter  Medication Sig Dispense Refill  . prenatal vitamin w/FE, FA (PRENATAL 1 + 1) 27-1 MG TABS Take 1 tablet by mouth at bedtime.        Allergies  Allergen Reactions  . Aspirin Anaphylaxis    ROS: Pertinent items in HPI  OBJECTIVE Blood pressure 117/60, pulse 117, temperature 98.4 F (36.9 C), temperature source Oral, resp. rate 18, height 5\' 6"  (1.676 m), weight 113.218 kg (249 lb 9.6 oz), unknown if currently breastfeeding. GENERAL: Well-developed, well-nourished female  in no acute distress.  HEENT: Normocephalic HEART: normal rate RESP: normal effort ABDOMEN: Soft, non-tender EXTREMITIES: Nontender, no edema NEURO: Alert and oriented SPECULUM EXAM: Deferred  FHT 156 by doppler Dilation: Closed Effacement (%): Thick Cervical Position: Posterior Exam by:: L. Leftwich-Kirby CNM  LAB RESULTS Results for orders placed during the hospital encounter of 01/16/13 (from the past 24 hour(s))  URINALYSIS, ROUTINE W REFLEX MICROSCOPIC     Status: Abnormal   Collection Time    01/16/13  5:00 AM      Result Value Range   Color, Urine YELLOW  YELLOW   APPearance CLEAR  CLEAR   Specific Gravity, Urine 1.025  1.005 - 1.030   pH 6.0  5.0 - 8.0   Glucose, UA NEGATIVE  NEGATIVE mg/dL   Hgb urine dipstick NEGATIVE  NEGATIVE   Bilirubin Urine SMALL (*) NEGATIVE   Ketones, ur 15 (*) NEGATIVE mg/dL   Protein, ur 30 (*) NEGATIVE mg/dL   Urobilinogen, UA 0.2  0.0 - 1.0 mg/dL   Nitrite NEGATIVE  NEGATIVE   Leukocytes, UA TRACE (*) NEGATIVE  URINE MICROSCOPIC-ADD ON     Status: Abnormal   Collection Time    01/16/13  5:00 AM      Result Value Range   Squamous Epithelial / LPF FEW (*) RARE   WBC, UA 3-6  <3  WBC/hpf   RBC / HPF 3-6  <3 RBC/hpf   Bacteria, UA FEW (*) RARE   Urine-Other MUCOUS PRESENT    CBC     Status: Abnormal   Collection Time    01/16/13  6:10 AM      Result Value Range   WBC 11.7 (*) 4.0 - 10.5 K/uL   RBC 4.41  3.87 - 5.11 MIL/uL   Hemoglobin 12.8  12.0 - 15.0 g/dL   HCT 69.6  29.5 - 28.4 %   MCV 86.8  78.0 - 100.0 fL   MCH 29.0  26.0 - 34.0 pg   MCHC 33.4  30.0 - 36.0 g/dL   RDW 13.2  44.0 - 10.2 %   Platelets 203  150 - 400 K/uL  COMPREHENSIVE METABOLIC PANEL     Status: Abnormal   Collection Time    01/16/13  6:10 AM      Result Value Range   Sodium 136  135 - 145 mEq/L   Potassium 3.9  3.5 - 5.1 mEq/L   Chloride 103  96 - 112 mEq/L   CO2 21  19 - 32 mEq/L   Glucose, Bld 129 (*) 70 - 99 mg/dL   BUN 10  6 - 23 mg/dL    Creatinine, Ser 7.25  0.50 - 1.10 mg/dL   Calcium 8.9  8.4 - 36.6 mg/dL   Total Protein 6.5  6.0 - 8.3 g/dL   Albumin 3.1 (*) 3.5 - 5.2 g/dL   AST 36  0 - 37 U/L   ALT 23  0 - 35 U/L   Alkaline Phosphatase 49  39 - 117 U/L   Total Bilirubin 0.3  0.3 - 1.2 mg/dL   GFR calc non Af Amer >90  >90 mL/min   GFR calc Af Amer >90  >90 mL/min    ASSESSMENT 1. Viral gastroenteritis     PLAN Phenergan 25 mg PO and GI Cocktail in MAU Talked with Dr Arelia Sneddon in MAU regarding assessment and findings Discharge home after tolerating PO fluids/some food Phenergan 12.5-25 mg PO Q 6 hours PRN Return to MAU as needed    Medication List    TAKE these medications       HYDROcodone-acetaminophen 2.5-500 MG per tablet  Commonly known as:  VICODIN  Take 1 tablet by mouth every 6 (six) hours as needed for pain.     ondansetron 4 MG disintegrating tablet  Commonly known as:  ZOFRAN-ODT  Take 4 mg by mouth every 8 (eight) hours as needed for nausea.     prenatal vitamin w/FE, FA 27-1 MG Tabs  Take 1 tablet by mouth at bedtime.     promethazine 25 MG tablet  Commonly known as:  PHENERGAN  Take 0.5-1 tablets (12.5-25 mg total) by mouth every 6 (six) hours as needed for nausea.         Sharen Counter Certified Nurse-Midwife 01/16/2013  6:02 AM  Assumed care from Collene Gobble -Craige Cotta at 0800 Pt vomited after the Zofran. Reglan 10 mg po given and retained x 1 hr. Dozing and ready for discharge. Will call office for F/U in 3 days. Danae Orleans, CNM 01/16/2013 10:11 AM

## 2013-01-16 NOTE — MAU Note (Signed)
Pt G2 P1 at 19wks having upper abd pain and vomiting.  Vomiting began 3/20 around 1700.  Unable to keep anything down.

## 2013-01-17 LAB — URINE CULTURE
Colony Count: NO GROWTH
Culture: NO GROWTH

## 2013-03-26 ENCOUNTER — Inpatient Hospital Stay (HOSPITAL_COMMUNITY)
Admission: AD | Admit: 2013-03-26 | Discharge: 2013-03-26 | Disposition: A | Discharge status: Home / Self Care | Payer: BC Managed Care – PPO | Source: Ambulatory Visit | Attending: Obstetrics and Gynecology | Admitting: Obstetrics and Gynecology

## 2013-03-26 ENCOUNTER — Encounter (HOSPITAL_COMMUNITY): Payer: Self-pay

## 2013-03-26 DIAGNOSIS — O9989 Other specified diseases and conditions complicating pregnancy, childbirth and the puerperium: Secondary | ICD-10-CM

## 2013-03-26 DIAGNOSIS — O99891 Other specified diseases and conditions complicating pregnancy: Secondary | ICD-10-CM | POA: Insufficient documentation

## 2013-03-26 DIAGNOSIS — O26893 Other specified pregnancy related conditions, third trimester: Secondary | ICD-10-CM

## 2013-03-26 DIAGNOSIS — R030 Elevated blood-pressure reading, without diagnosis of hypertension: Secondary | ICD-10-CM | POA: Insufficient documentation

## 2013-03-26 DIAGNOSIS — R51 Headache: Secondary | ICD-10-CM | POA: Insufficient documentation

## 2013-03-26 LAB — COMPREHENSIVE METABOLIC PANEL
ALT: 16 U/L (ref 0–35)
AST: 24 U/L (ref 0–37)
Albumin: 3.1 g/dL — ABNORMAL LOW (ref 3.5–5.2)
Alkaline Phosphatase: 66 U/L (ref 39–117)
BUN: 6 mg/dL (ref 6–23)
CO2: 23 mEq/L (ref 19–32)
Calcium: 9.1 mg/dL (ref 8.4–10.5)
Chloride: 104 mEq/L (ref 96–112)
Creatinine, Ser: 0.61 mg/dL (ref 0.50–1.10)
GFR calc Af Amer: >90 mL/min (ref >90)
GFR calc non Af Amer: >90 mL/min (ref >90)
Glucose, Bld: 86 mg/dL (ref 70–99)
Potassium: 3.9 mEq/L (ref 3.5–5.1)
Sodium: 137 mEq/L (ref 135–145)
Total Bilirubin: 0.2 mg/dL — ABNORMAL LOW (ref 0.3–1.2)
Total Protein: 6.5 g/dL (ref 6.0–8.3)

## 2013-03-26 LAB — URINALYSIS, ROUTINE W REFLEX MICROSCOPIC
Bilirubin Urine: NEGATIVE
Glucose, UA: NEGATIVE mg/dL
Ketones, ur: NEGATIVE mg/dL
Nitrite: NEGATIVE
Protein, ur: NEGATIVE mg/dL
Specific Gravity, Urine: 1.015 (ref 1.005–1.030)
Urobilinogen, UA: 0.2 mg/dL (ref 0.0–1.0)
pH: 6.5 (ref 5.0–8.0)

## 2013-03-26 LAB — CBC
HCT: 36 % (ref 36.0–46.0)
Hemoglobin: 11.8 g/dL — ABNORMAL LOW (ref 12.0–15.0)
MCH: 28.2 pg (ref 26.0–34.0)
MCHC: 32.8 g/dL (ref 30.0–36.0)
MCV: 86.1 fL (ref 78.0–100.0)
Platelets: 204 10*3/uL (ref 150–400)
RBC: 4.18 MIL/uL (ref 3.87–5.11)
RDW: 14.2 % (ref 11.5–15.5)
WBC: 12.7 10*3/uL — ABNORMAL HIGH (ref 4.0–10.5)

## 2013-03-26 LAB — URINE MICROSCOPIC-ADD ON

## 2013-03-26 LAB — LACTATE DEHYDROGENASE: LDH: 152 U/L (ref 94–250)

## 2013-03-26 MED ORDER — DEXAMETHASONE SODIUM PHOSPHATE 10 MG/ML IJ SOLN
10.0000 mg | Freq: Once | INTRAMUSCULAR | Status: AC
  Administered 2013-03-26: 10 mg via INTRAVENOUS
  Filled 2013-03-26: qty 1

## 2013-03-26 MED ORDER — OXYCODONE-ACETAMINOPHEN 5-325 MG PO TABS
2.0000 | ORAL_TABLET | Freq: Once | ORAL | Status: AC
  Administered 2013-03-26: 2 via ORAL
  Filled 2013-03-26: qty 2

## 2013-03-26 MED ORDER — HYDROXYZINE HCL 25 MG PO TABS: 25.0000 mg | ORAL_TABLET | Freq: Four times a day (QID) | ORAL | Status: DC

## 2013-03-26 MED ORDER — OXYCODONE-ACETAMINOPHEN 5-325 MG PO TABS: 2.0000 | ORAL_TABLET | ORAL | Status: DC | PRN

## 2013-03-26 MED ORDER — CYCLOBENZAPRINE HCL 10 MG PO TABS: 10.0000 mg | ORAL_TABLET | Freq: Two times a day (BID) | ORAL | Status: DC | PRN

## 2013-03-26 MED ORDER — METOCLOPRAMIDE HCL 5 MG/ML IJ SOLN
10.0000 mg | Freq: Once | INTRAMUSCULAR | Status: AC
  Administered 2013-03-26: 10 mg via INTRAVENOUS
  Filled 2013-03-26: qty 2

## 2013-03-26 MED ORDER — CYCLOBENZAPRINE HCL 10 MG PO TABS
10.0000 mg | ORAL_TABLET | Freq: Once | ORAL | Status: AC
  Administered 2013-03-26: 10 mg via ORAL
  Filled 2013-03-26: qty 1

## 2013-03-26 MED ORDER — DEXTROSE 5 % IN LACTATED RINGERS IV BOLUS
1000.0000 mL | Freq: Once | INTRAVENOUS | Status: AC
  Administered 2013-03-26: 1000 mL via INTRAVENOUS

## 2013-03-26 NOTE — MAU Note (Signed)
Patient states she was seen in the office today and sent to MAU for evaluation of elevated blood pressure. Patient states she has had a headache for 3 days and had a nosebleed today. No contractions, bleeding or leaking and reports good fetal movement.

## 2013-03-26 NOTE — MAU Provider Note (Signed)
History     CSN: [id number]  Arrival date and time: 03/26/13 1051   First Provider Initiated Contact with Patient 03/26/13 1231      Chief Complaint  Patient presents with  . Headache  . Hypertension   HPI  Pt is [redacted]w[redacted]d pregnant with presentation with elevated BP 140/90 with proteinuria.  Pt has hx of migraines and has had a headache for 3 days.  Pt last took VIcodin last night without relief.  Pt has had spots before her eyes yesterday and today.  She is sensitive to light .She has been nauseated. She denies constipation or diarrhea.  Pt has hx of kidney stones and has had some right flank cramping from time to time.  Pt denies vaginal bleeding or spotting.  PT has hx of headaches and has had a CT evaluation 12/11.  Pt previously got relief with Percocet and Flexeril.  Past Medical History  Diagnosis Date  . Hyperthyroidism   . Kidney stones   . Depression     pt was 27 yo-no longer medicated  . Asthma   . Migraines   . Complication of anesthesia     Past Surgical History  Procedure Laterality Date  . Cholecystectomy    . Appendectomy    . Parathyroidectomy  2010  . Lithotripsy      Family History  Problem Relation Age of Onset  . Hypertension Father   . Diabetes Father   . Heart disease Father   . Heart disease Mother     History  Substance Use Topics  . Smoking status: Never Smoker   . Smokeless tobacco: Not on file  . Alcohol Use: No    Allergies:  Allergies  Allergen Reactions  . Aspirin Anaphylaxis    Prescriptions prior to admission  Medication Sig Dispense Refill  . HYDROcodone-acetaminophen (VICODIN) 2.5-500 MG per tablet Take 1 tablet by mouth every 6 (six) hours as needed for pain.      . Prenatal Vit-Fe Fumarate-FA (PRENATAL MULTIVITAMIN) TABS Take 1 tablet by mouth at bedtime.      . promethazine (PHENERGAN) 25 MG tablet Take 25 mg by mouth every 6 (six) hours as needed for nausea.      . [DISCONTINUED] promethazine (PHENERGAN) 25 MG tablet  Take 0.5-1 tablets (12.5-25 mg total) by mouth every 6 (six) hours as needed for nausea.  30 tablet  2    ROS Physical Exam   Blood pressure 143/95, pulse 119, temperature 98.6 F (37 C), temperature source Oral, resp. rate 20, height 5\' 7"  (1.702 m), weight 115.032 kg (253 lb 9.6 oz), SpO2 98.00%.  Physical Exam  MAU Course  Procedures Discussed with Dr. Renaldo Fiddler- [facility] labs ordered Percocet 2 tabs ordered- pt did not get relief from headache- Reglan and Decadron ordered IV; pt requests no Benadryl because it makes her hyper Results for orders placed during the hospital encounter of 03/26/13 (from the past 24 hour(s))  URINALYSIS, ROUTINE W REFLEX MICROSCOPIC     Status: Abnormal   Collection Time    03/26/13 11:45 AM      Result Value Range   Color, Urine YELLOW  YELLOW   APPearance CLEAR  CLEAR   Specific Gravity, Urine 1.015  1.005 - 1.030   pH 6.5  5.0 - 8.0   Glucose, UA NEGATIVE  NEGATIVE mg/dL   Hgb urine dipstick TRACE (*) NEGATIVE   Bilirubin Urine NEGATIVE  NEGATIVE   Ketones, ur NEGATIVE  NEGATIVE mg/dL   Protein, ur NEGATIVE  NEGATIVE mg/dL   Urobilinogen, UA 0.2  0.0 - 1.0 mg/dL   Nitrite NEGATIVE  NEGATIVE   Leukocytes, UA SMALL (*) NEGATIVE  URINE MICROSCOPIC-ADD ON     Status: Abnormal   Collection Time    03/26/13 11:45 AM      Result Value Range   Squamous Epithelial / LPF FEW (*) RARE   WBC, UA 0-2  <3 WBC/hpf   RBC / HPF 0-2  <3 RBC/hpf   Bacteria, UA RARE  RARE   Urine-Other MUCOUS PRESENT    CBC     Status: Abnormal   Collection Time    03/26/13  1:05 PM      Result Value Range   WBC 12.7 (*) 4.0 - 10.5 K/uL   RBC 4.18  3.87 - 5.11 MIL/uL   Hemoglobin 11.8 (*) 12.0 - 15.0 g/dL   HCT 40.9  81.1 - 91.4 %   MCV 86.1  78.0 - 100.0 fL   MCH 28.2  26.0 - 34.0 pg   MCHC 32.8  30.0 - 36.0 g/dL   RDW 78.2  95.6 - 21.3 %   Platelets 204  150 - 400 K/uL  COMPREHENSIVE METABOLIC PANEL     Status: Abnormal   Collection Time    03/26/13  1:05 PM       Result Value Range   Sodium 137  135 - 145 mEq/L   Potassium 3.9  3.5 - 5.1 mEq/L   Chloride 104  96 - 112 mEq/L   CO2 23  19 - 32 mEq/L   Glucose, Bld 86  70 - 99 mg/dL   BUN 6  6 - 23 mg/dL   Creatinine, Ser 0.86  0.50 - 1.10 mg/dL   Calcium 9.1  8.4 - 57.8 mg/dL   Total Protein 6.5  6.0 - 8.3 g/dL   Albumin 3.1 (*) 3.5 - 5.2 g/dL   AST 24  0 - 37 U/L   ALT 16  0 - 35 U/L   Alkaline Phosphatase 66  39 - 117 U/L   Total Bilirubin 0.2 (*) 0.3 - 1.2 mg/dL   GFR calc non Af Amer >90  >90 mL/min   GFR calc Af Amer >90  >90 mL/min  LACTATE DEHYDROGENASE     Status: None   Collection Time    03/26/13  1:05 PM      Result Value Range   LDH 152  94 - 250 U/L  pt's headache diminished to level 3/10 after IVF and Decadron and Reglan.  Pt wants to go home but afraid that headache will return.  Will try Flexeril. Assessment and Plan  Headache in pregnancy- prescription for Percocet and Flexeril along with Vistaril if needed F/u with OB appointment next week Elevated BP- normal PIH labs- pt normotensive in MAU  Thanh Mottern 03/26/2013, 12:32 PM

## 2013-04-18 ENCOUNTER — Inpatient Hospital Stay (HOSPITAL_COMMUNITY)
Admission: AD | Admit: 2013-04-18 | Discharge: 2013-04-18 | Disposition: A | Discharge status: Home / Self Care | Payer: BC Managed Care – PPO | Source: Ambulatory Visit | Attending: Obstetrics and Gynecology | Admitting: Obstetrics and Gynecology

## 2013-04-18 ENCOUNTER — Encounter (HOSPITAL_COMMUNITY): Payer: Self-pay | Admitting: *Deleted

## 2013-04-18 ENCOUNTER — Inpatient Hospital Stay (HOSPITAL_COMMUNITY): Payer: BC Managed Care – PPO

## 2013-04-18 DIAGNOSIS — M545 Low back pain, unspecified: Secondary | ICD-10-CM | POA: Insufficient documentation

## 2013-04-18 DIAGNOSIS — R109 Unspecified abdominal pain: Secondary | ICD-10-CM | POA: Insufficient documentation

## 2013-04-18 DIAGNOSIS — N200 Calculus of kidney: Secondary | ICD-10-CM | POA: Insufficient documentation

## 2013-04-18 DIAGNOSIS — O26839 Pregnancy related renal disease, unspecified trimester: Secondary | ICD-10-CM | POA: Insufficient documentation

## 2013-04-18 LAB — CBC
HCT: 36.5 % (ref 36.0–46.0)
Hemoglobin: 12.1 g/dL (ref 12.0–15.0)
MCH: 27.9 pg (ref 26.0–34.0)
MCHC: 33.2 g/dL (ref 30.0–36.0)
MCV: 84.1 fL (ref 78.0–100.0)
Platelets: 219 10*3/uL (ref 150–400)
RBC: 4.34 MIL/uL (ref 3.87–5.11)
RDW: 14.2 % (ref 11.5–15.5)
WBC: 13.7 10*3/uL — ABNORMAL HIGH (ref 4.0–10.5)

## 2013-04-18 LAB — URINALYSIS, ROUTINE W REFLEX MICROSCOPIC
Bilirubin Urine: NEGATIVE
Glucose, UA: NEGATIVE mg/dL
Ketones, ur: NEGATIVE mg/dL
Nitrite: NEGATIVE
Protein, ur: NEGATIVE mg/dL
Specific Gravity, Urine: 1.01 (ref 1.005–1.030)
Urobilinogen, UA: 0.2 mg/dL (ref 0.0–1.0)
pH: 6.5 (ref 5.0–8.0)

## 2013-04-18 LAB — URINE MICROSCOPIC-ADD ON

## 2013-04-18 LAB — COMPREHENSIVE METABOLIC PANEL
ALT: 16 U/L (ref 0–35)
AST: 25 U/L (ref 0–37)
Albumin: 2.9 g/dL — ABNORMAL LOW (ref 3.5–5.2)
Alkaline Phosphatase: 83 U/L (ref 39–117)
BUN: 5 mg/dL — ABNORMAL LOW (ref 6–23)
CO2: 21 mEq/L (ref 19–32)
Calcium: 9.2 mg/dL (ref 8.4–10.5)
Chloride: 105 mEq/L (ref 96–112)
Creatinine, Ser: 0.51 mg/dL (ref 0.50–1.10)
GFR calc Af Amer: >90 mL/min (ref >90)
GFR calc non Af Amer: >90 mL/min (ref >90)
Glucose, Bld: 109 mg/dL — ABNORMAL HIGH (ref 70–99)
Potassium: 3.7 mEq/L (ref 3.5–5.1)
Sodium: 138 mEq/L (ref 135–145)
Total Bilirubin: 0.2 mg/dL — ABNORMAL LOW (ref 0.3–1.2)
Total Protein: 6.1 g/dL (ref 6.0–8.3)

## 2013-04-18 MED ORDER — HYDROMORPHONE HCL PF 1 MG/ML IJ SOLN
1.0000 mg | Freq: Once | INTRAMUSCULAR | Status: AC
  Administered 2013-04-18: 1 mg via INTRAVENOUS

## 2013-04-18 MED ORDER — OXYCODONE-ACETAMINOPHEN 5-325 MG PO TABS
2.0000 | ORAL_TABLET | Freq: Once | ORAL | Status: AC
  Administered 2013-04-18: 2 via ORAL
  Filled 2013-04-18: qty 2

## 2013-04-18 MED ORDER — LACTATED RINGERS IV BOLUS (SEPSIS)
1000.0000 mL | Freq: Once | INTRAVENOUS | Status: AC
  Administered 2013-04-18: 1000 mL via INTRAVENOUS

## 2013-04-18 MED ORDER — HYDROMORPHONE HCL PF 1 MG/ML IJ SOLN
1.0000 mg | Freq: Once | INTRAMUSCULAR | Status: DC
  Filled 2013-04-18: qty 1

## 2013-04-18 MED ORDER — HYDROMORPHONE HCL 2 MG PO TABS: 2.0000 mg | ORAL_TABLET | ORAL | Status: DC | PRN

## 2013-04-18 NOTE — Progress Notes (Signed)
Written and verbal d/c instructions given and understanding voiced. Joseph Berkshire PA in earlier and discussed d/c plan

## 2013-04-18 NOTE — MAU Provider Note (Signed)
History     CSN: [id number]  Arrival date and time: 04/18/13 1629   First Provider Initiated Contact with Patient 04/18/13 1708      Chief Complaint  Patient presents with  . Abdominal Pain   HPI [name] is a 27 y.o. G2P0101 at [redacted]w[redacted]d who presents to MAU today with complaint of abdominal pain. The pain started in the lower mid-pelvic region around noon today and has become more diffuse and prominent along the left side of the abdomen. The patient states that she has also had some low back pain today. She rates her pain at 9/10. She states that she had some occasional contractions earlier today, but none recently. She denies vaginal bleeding, discharge, LOF, UTI symptoms, fever or peripheral edema. The patient has been nausea "from the pain." She reports good fetal movement.   OB History   Grav Para Term Preterm Abortions TAB SAB Ect Mult Living   2 1 0 1 0 0 0 0 0 1       Past Medical History  Diagnosis Date  . Hyperthyroidism   . Kidney stones   . Depression     pt was 27 yo-no longer medicated  . Asthma   . Migraines   . Complication of anesthesia     Past Surgical History  Procedure Laterality Date  . Cholecystectomy    . Appendectomy    . Parathyroidectomy  2010  . Lithotripsy      Family History  Problem Relation Age of Onset  . Hypertension Father   . Diabetes Father   . Heart disease Father   . Heart disease Mother     History  Substance Use Topics  . Smoking status: Never Smoker   . Smokeless tobacco: Not on file  . Alcohol Use: No    Allergies:  Allergies  Allergen Reactions  . Aspirin Anaphylaxis    Prescriptions prior to admission  Medication Sig Dispense Refill  . cyclobenzaprine (FLEXERIL) 10 MG tablet Take 1 tablet (10 mg total) by mouth 2 (two) times daily as needed for muscle spasms.  30 tablet  0  . Prenatal Vit-Fe Fumarate-FA (PRENATAL MULTIVITAMIN) TABS Take 1 tablet by mouth at bedtime.      . promethazine (PHENERGAN) 25  MG tablet Take 25 mg by mouth every 6 (six) hours as needed for nausea.      . [DISCONTINUED] oxyCODONE-acetaminophen (PERCOCET/ROXICET) 5-325 MG per tablet Take 2 tablets by mouth every 4 (four) hours as needed for pain.  20 tablet  0    Review of Systems  Constitutional: Negative for fever and malaise/fatigue.  Gastrointestinal: Positive for nausea and abdominal pain. Negative for vomiting, diarrhea and constipation.  Genitourinary: Negative for dysuria, urgency and frequency.       Neg - vaginal bleeding, discharge, LOF  Musculoskeletal: Positive for back pain.   Physical Exam   Blood pressure 145/90, pulse 124, temperature 98.4 F (36.9 C), temperature source Oral, resp. rate 18, height 5\' 6"  (1.676 m), weight 255 lb (115.667 kg).  Physical Exam  Constitutional: She is oriented to person, place, and time. She appears well-developed and well-nourished. No distress.  HENT:  Head: Normocephalic and atraumatic.  Cardiovascular: Regular rhythm and normal heart sounds.  Tachycardia present.   Respiratory: Effort normal and breath sounds normal. No respiratory distress.  GI: Soft. Bowel sounds are normal. She exhibits no distension and no mass. There is no tenderness. There is no rebound and no guarding.  Musculoskeletal: She exhibits no  edema.  Neurological: She is alert and oriented to person, place, and time.  Skin: Skin is warm and dry. No erythema.  Psychiatric: She has a normal mood and affect.   Results for orders placed during the hospital encounter of 04/18/13 (from the past 24 hour(s))  URINALYSIS, ROUTINE W REFLEX MICROSCOPIC     Status: Abnormal   Collection Time    04/18/13  4:30 PM      Result Value Range   Color, Urine YELLOW  YELLOW   APPearance CLEAR  CLEAR   Specific Gravity, Urine 1.010  1.005 - 1.030   pH 6.5  5.0 - 8.0   Glucose, UA NEGATIVE  NEGATIVE mg/dL   Hgb urine dipstick MODERATE (*) NEGATIVE   Bilirubin Urine NEGATIVE  NEGATIVE   Ketones, ur NEGATIVE   NEGATIVE mg/dL   Protein, ur NEGATIVE  NEGATIVE mg/dL   Urobilinogen, UA 0.2  0.0 - 1.0 mg/dL   Nitrite NEGATIVE  NEGATIVE   Leukocytes, UA TRACE (*) NEGATIVE  URINE MICROSCOPIC-ADD ON     Status: Abnormal   Collection Time    04/18/13  4:30 PM      Result Value Range   Squamous Epithelial / LPF FEW (*) RARE   WBC, UA 0-2  <3 WBC/hpf   RBC / HPF 7-10  <3 RBC/hpf   Crystals CA OXALATE CRYSTALS (*) NEGATIVE   Urine-Other MUCOUS PRESENT    CBC     Status: Abnormal   Collection Time    04/18/13  5:49 PM      Result Value Range   WBC 13.7 (*) 4.0 - 10.5 K/uL   RBC 4.34  3.87 - 5.11 MIL/uL   Hemoglobin 12.1  12.0 - 15.0 g/dL   HCT 45.4  09.8 - 11.9 %   MCV 84.1  78.0 - 100.0 fL   MCH 27.9  26.0 - 34.0 pg   MCHC 33.2  30.0 - 36.0 g/dL   RDW 14.7  82.9 - 56.2 %   Platelets 219  150 - 400 K/uL  COMPREHENSIVE METABOLIC PANEL     Status: Abnormal   Collection Time    04/18/13  5:49 PM      Result Value Range   Sodium 138  135 - 145 mEq/L   Potassium 3.7  3.5 - 5.1 mEq/L   Chloride 105  96 - 112 mEq/L   CO2 21  19 - 32 mEq/L   Glucose, Bld 109 (*) 70 - 99 mg/dL   BUN 5 (*) 6 - 23 mg/dL   Creatinine, Ser 1.30  0.50 - 1.10 mg/dL   Calcium 9.2  8.4 - 86.5 mg/dL   Total Protein 6.1  6.0 - 8.3 g/dL   Albumin 2.9 (*) 3.5 - 5.2 g/dL   AST 25  0 - 37 U/L   ALT 16  0 - 35 U/L   Alkaline Phosphatase 83  39 - 117 U/L   Total Bilirubin 0.2 (*) 0.3 - 1.2 mg/dL   GFR calc non Af Amer >90  >90 mL/min   GFR calc Af Amer >90  >90 mL/min   US Renal  04/18/2013   *RADIOLOGY REPORT*  Clinical Data: [redacted] weeks pregnant, left-sided pain  RENAL/URINARY TRACT ULTRASOUND COMPLETE  Comparison:  Findings:  Right Kidney = 12.7 cm.  There is no hydronephrosis.  There are several small anechoic cysts.  Left kidney = 13.5 cm.  There are three echogenic stones measuring from 6 mm to 10 mm.  No hydronephrosis.  Bladder:  Bilateral ureteral jets are identified.  Bladder appears normal.  IMPRESSION:  1.  No  hydronephrosis. 2.  Three calculi within the left kidney. 3.  Ureteral jets identified on the left and right.   Original Report Authenticated By: Genevive Bi, M.D.    MAU Course  Procedures None  MDM Discussed with Dr. Thana Ates. 1 L LR bolus, CBC, CMP, Renal US and 2 Percocet for pain.  Discussed lab and [place] results with Dr. Thana Ates. Patient will need urology referral first thing on Monday. Should call office for appointment.  Patient states no relief from Percocet. Consulted with Dr. Thana Ates. 1 mg IV Dilaudid. Patient now rates pain at 5/10. Patient has appeared very comfortable the entire time in MAU. She has been sitting still and playing on her phone since arrival.   Assessment and Plan  A: Kidney stones in pregnancy  P: Discharge home Rx for Dilaudid given to patient Patient advised to call office on Monday morning to arrange further management with OB and Urology Patient may return to MAU as needed or if her condition were to change or worsen  Freddi Starr, PA-C  04/18/2013, 8:16 PM

## 2013-04-18 NOTE — MAU Note (Signed)
Having back abdominal and back pain.  Having some nausea

## 2013-05-11 LAB — OB RESULTS CONSOLE GBS: GBS: POSITIVE

## 2013-05-27 ENCOUNTER — Encounter (HOSPITAL_COMMUNITY): Payer: Self-pay | Admitting: *Deleted

## 2013-05-27 ENCOUNTER — Inpatient Hospital Stay (HOSPITAL_COMMUNITY)
Admission: AD | Admit: 2013-05-27 | Discharge: 2013-05-29 | DRG: 373 | Disposition: A | Discharge status: Home / Self Care | Payer: BC Managed Care – PPO | Source: Ambulatory Visit | Attending: Obstetrics and Gynecology | Admitting: Obstetrics and Gynecology

## 2013-05-27 ENCOUNTER — Inpatient Hospital Stay (HOSPITAL_COMMUNITY): Payer: BC Managed Care – PPO | Admitting: Anesthesiology

## 2013-05-27 ENCOUNTER — Encounter (HOSPITAL_COMMUNITY): Payer: Self-pay | Admitting: Anesthesiology

## 2013-05-27 DIAGNOSIS — Z22330 Carrier of Group B streptococcus: Secondary | ICD-10-CM

## 2013-05-27 DIAGNOSIS — N200 Calculus of kidney: Secondary | ICD-10-CM | POA: Diagnosis present

## 2013-05-27 DIAGNOSIS — E079 Disorder of thyroid, unspecified: Secondary | ICD-10-CM | POA: Diagnosis present

## 2013-05-27 DIAGNOSIS — O99284 Endocrine, nutritional and metabolic diseases complicating childbirth: Secondary | ICD-10-CM | POA: Diagnosis present

## 2013-05-27 DIAGNOSIS — O99892 Other specified diseases and conditions complicating childbirth: Principal | ICD-10-CM | POA: Diagnosis present

## 2013-05-27 DIAGNOSIS — E0590 Thyrotoxicosis, unspecified without thyrotoxic crisis or storm: Secondary | ICD-10-CM | POA: Diagnosis present

## 2013-05-27 LAB — CBC
HCT: 37.9 % (ref 36.0–46.0)
Hemoglobin: 12.5 g/dL (ref 12.0–15.0)
MCH: 27.3 pg (ref 26.0–34.0)
MCHC: 33 g/dL (ref 30.0–36.0)
MCV: 82.8 fL (ref 78.0–100.0)
Platelets: 178 10*3/uL (ref 150–400)
RBC: 4.58 MIL/uL (ref 3.87–5.11)
RDW: 15.4 % (ref 11.5–15.5)
WBC: 14.8 10*3/uL — ABNORMAL HIGH (ref 4.0–10.5)

## 2013-05-27 MED ORDER — PHENYLEPHRINE 40 MCG/ML (10ML) SYRINGE FOR IV PUSH (FOR BLOOD PRESSURE SUPPORT)
80.0000 ug | PREFILLED_SYRINGE | INTRAVENOUS | Status: DC | PRN
  Filled 2013-05-27: qty 2
  Filled 2013-05-27: qty 5

## 2013-05-27 MED ORDER — ACETAMINOPHEN 325 MG PO TABS
650.0000 mg | ORAL_TABLET | ORAL | Status: DC | PRN
  Administered 2013-05-28: 650 mg via ORAL
  Filled 2013-05-27: qty 2

## 2013-05-27 MED ORDER — EPHEDRINE 5 MG/ML INJ
10.0000 mg | INTRAVENOUS | Status: DC | PRN
  Filled 2013-05-27: qty 4
  Filled 2013-05-27: qty 2

## 2013-05-27 MED ORDER — FENTANYL 2.5 MCG/ML BUPIVACAINE 1/10 % EPIDURAL INFUSION (WH - ANES)
14.0000 mL/h | INTRAMUSCULAR | Status: DC | PRN
  Administered 2013-05-27 – 2013-05-28 (×2): 14 mL/h via EPIDURAL
  Filled 2013-05-27 (×2): qty 125

## 2013-05-27 MED ORDER — FLEET ENEMA 7-19 GM/118ML RE ENEM: 1.0000 | ENEMA | Freq: Once | RECTAL | Status: DC

## 2013-05-27 MED ORDER — OXYTOCIN BOLUS FROM INFUSION: 500.0000 mL | INTRAVENOUS | Status: DC

## 2013-05-27 MED ORDER — PENICILLIN G POTASSIUM 5000000 UNITS IJ SOLR
2.5000 10*6.[IU] | INTRAMUSCULAR | Status: DC
  Administered 2013-05-28 (×2): 2.5 10*6.[IU] via INTRAVENOUS
  Filled 2013-05-27 (×4): qty 2.5

## 2013-05-27 MED ORDER — CITRIC ACID-SODIUM CITRATE 334-500 MG/5ML PO SOLN: 30.0000 mL | ORAL | Status: DC | PRN

## 2013-05-27 MED ORDER — PHENYLEPHRINE 40 MCG/ML (10ML) SYRINGE FOR IV PUSH (FOR BLOOD PRESSURE SUPPORT)
80.0000 ug | PREFILLED_SYRINGE | INTRAVENOUS | Status: DC | PRN
  Filled 2013-05-27: qty 2

## 2013-05-27 MED ORDER — LIDOCAINE HCL (PF) 1 % IJ SOLN
30.0000 mL | INTRAMUSCULAR | Status: DC | PRN
  Filled 2013-05-27: qty 30

## 2013-05-27 MED ORDER — LIDOCAINE HCL (PF) 1 % IJ SOLN
INTRAMUSCULAR | Status: DC | PRN
  Administered 2013-05-27 (×2): 5 mL

## 2013-05-27 MED ORDER — DIPHENHYDRAMINE HCL 50 MG/ML IJ SOLN
12.5000 mg | INTRAMUSCULAR | Status: AC | PRN
  Administered 2013-05-28 (×3): 12.5 mg via INTRAVENOUS
  Filled 2013-05-27: qty 1

## 2013-05-27 MED ORDER — LACTATED RINGERS IV SOLN: 500.0000 mL | INTRAVENOUS | Status: DC | PRN

## 2013-05-27 MED ORDER — LACTATED RINGERS IV SOLN
INTRAVENOUS | Status: DC
  Administered 2013-05-27 – 2013-05-28 (×2): via INTRAVENOUS

## 2013-05-27 MED ORDER — IBUPROFEN 600 MG PO TABS: 600.0000 mg | ORAL_TABLET | Freq: Four times a day (QID) | ORAL | Status: DC | PRN

## 2013-05-27 MED ORDER — PENICILLIN G POTASSIUM 5000000 UNITS IJ SOLR
5.0000 10*6.[IU] | Freq: Once | INTRAMUSCULAR | Status: AC
  Administered 2013-05-27: 5 10*6.[IU] via INTRAVENOUS
  Filled 2013-05-27: qty 5

## 2013-05-27 MED ORDER — ONDANSETRON HCL 4 MG/2ML IJ SOLN
4.0000 mg | Freq: Four times a day (QID) | INTRAMUSCULAR | Status: DC | PRN
  Administered 2013-05-27 – 2013-05-28 (×2): 4 mg via INTRAVENOUS
  Filled 2013-05-27 (×2): qty 2

## 2013-05-27 MED ORDER — EPHEDRINE 5 MG/ML INJ
10.0000 mg | INTRAVENOUS | Status: DC | PRN
  Filled 2013-05-27: qty 2

## 2013-05-27 MED ORDER — OXYTOCIN 40 UNITS IN LACTATED RINGERS INFUSION - SIMPLE MED: 62.5000 mL/h | INTRAVENOUS | Status: DC

## 2013-05-27 MED ORDER — OXYCODONE-ACETAMINOPHEN 5-325 MG PO TABS: 1.0000 | ORAL_TABLET | ORAL | Status: DC | PRN

## 2013-05-27 MED ORDER — LACTATED RINGERS IV SOLN: 500.0000 mL | Freq: Once | INTRAVENOUS | Status: DC

## 2013-05-27 NOTE — MAU Note (Signed)
Pt states was 5cm yesterday in office-having u/c's and bloody show now

## 2013-05-27 NOTE — Anesthesia Procedure Notes (Signed)

## 2013-05-27 NOTE — Anesthesia Preprocedure Evaluation (Signed)
Anesthesia Evaluation  Patient identified by MRN, date of birth, ID band Patient awake    Reviewed: Allergy & Precautions, H&P , Patient's Chart, lab work & pertinent test results  History of Anesthesia Complications (+) PROLONGED EMERGENCE  Airway Mallampati: III TM Distance: >3 FB Neck ROM: full    Dental no notable dental hx.    Pulmonary neg pulmonary ROS, asthma ,  breath sounds clear to auscultation  Pulmonary exam normal       Cardiovascular negative cardio ROS  Rhythm:regular Rate:Normal     Neuro/Psych  Headaches, PSYCHIATRIC DISORDERS Depression negative neurological ROS  negative psych ROS   GI/Hepatic negative GI ROS, Neg liver ROS,   Endo/Other  negative endocrine ROSHyperthyroidism Morbid obesity  Renal/GU Renal diseasenegative Renal ROS     Musculoskeletal   Abdominal   Peds  Hematology negative hematology ROS (+)   Anesthesia Other Findings  Hyperthyroidism     Kidney stones        Depression   pt was 27 yo-no longer medicated Asthma        Migraines     Complication of anesthesia    Reproductive/Obstetrics (+) Pregnancy                           Anesthesia Physical Anesthesia Plan  ASA: III  Anesthesia Plan: Epidural   Post-op Pain Management:    Induction:   Airway Management Planned:   Additional Equipment:   Intra-op Plan:   Post-operative Plan:   Informed Consent: I have reviewed the patients History and Physical, chart, labs and discussed the procedure including the risks, benefits and alternatives for the proposed anesthesia with the patient or authorized representative who has indicated his/her understanding and acceptance.     Plan Discussed with:   Anesthesia Plan Comments:         Anesthesia Quick Evaluation

## 2013-05-28 ENCOUNTER — Encounter (HOSPITAL_COMMUNITY): Payer: Self-pay | Admitting: *Deleted

## 2013-05-28 LAB — TYPE AND SCREEN
ABO/RH(D): O POS
Antibody Screen: NEGATIVE

## 2013-05-28 LAB — RPR: RPR Ser Ql: NONREACTIVE

## 2013-05-28 MED ORDER — WITCH HAZEL-GLYCERIN EX PADS: 1.0000 "application " | MEDICATED_PAD | CUTANEOUS | Status: DC | PRN

## 2013-05-28 MED ORDER — SENNOSIDES-DOCUSATE SODIUM 8.6-50 MG PO TABS
2.0000 | ORAL_TABLET | Freq: Every day | ORAL | Status: DC
  Administered 2013-05-28: 2 via ORAL

## 2013-05-28 MED ORDER — ONDANSETRON HCL 4 MG PO TABS: 4.0000 mg | ORAL_TABLET | ORAL | Status: DC | PRN

## 2013-05-28 MED ORDER — ONDANSETRON HCL 4 MG/2ML IJ SOLN: 4.0000 mg | INTRAMUSCULAR | Status: DC | PRN

## 2013-05-28 MED ORDER — DIPHENHYDRAMINE HCL 25 MG PO CAPS: 25.0000 mg | ORAL_CAPSULE | Freq: Four times a day (QID) | ORAL | Status: DC | PRN

## 2013-05-28 MED ORDER — MEASLES, MUMPS & RUBELLA VAC ~~LOC~~ INJ
0.5000 mL | INJECTION | Freq: Once | SUBCUTANEOUS | Status: DC
  Filled 2013-05-28: qty 0.5

## 2013-05-28 MED ORDER — OXYCODONE-ACETAMINOPHEN 5-325 MG PO TABS
1.0000 | ORAL_TABLET | Freq: Four times a day (QID) | ORAL | Status: DC | PRN
  Administered 2013-05-28 – 2013-05-29 (×2): 1 via ORAL
  Filled 2013-05-28 (×2): qty 1

## 2013-05-28 MED ORDER — ZOLPIDEM TARTRATE 5 MG PO TABS: 5.0000 mg | ORAL_TABLET | Freq: Every evening | ORAL | Status: DC | PRN

## 2013-05-28 MED ORDER — TETANUS-DIPHTH-ACELL PERTUSSIS 5-2.5-18.5 LF-MCG/0.5 IM SUSP: 0.5000 mL | Freq: Once | INTRAMUSCULAR | Status: DC

## 2013-05-28 MED ORDER — IBUPROFEN 800 MG PO TABS
800.0000 mg | ORAL_TABLET | Freq: Three times a day (TID) | ORAL | Status: DC | PRN
  Administered 2013-05-28 – 2013-05-29 (×3): 800 mg via ORAL
  Filled 2013-05-28 (×4): qty 1

## 2013-05-28 MED ORDER — OXYTOCIN 40 UNITS IN LACTATED RINGERS INFUSION - SIMPLE MED
1.0000 m[IU]/min | INTRAVENOUS | Status: DC
  Administered 2013-05-28: 1 m[IU]/min via INTRAVENOUS
  Filled 2013-05-28: qty 1000

## 2013-05-28 MED ORDER — DIBUCAINE 1 % RE OINT: 1.0000 "application " | TOPICAL_OINTMENT | RECTAL | Status: DC | PRN

## 2013-05-28 MED ORDER — SIMETHICONE 80 MG PO CHEW: 80.0000 mg | CHEWABLE_TABLET | ORAL | Status: DC | PRN

## 2013-05-28 MED ORDER — PRENATAL MULTIVITAMIN CH
1.0000 | ORAL_TABLET | Freq: Every day | ORAL | Status: DC
  Filled 2013-05-28: qty 1

## 2013-05-28 MED ORDER — BENZOCAINE-MENTHOL 20-0.5 % EX AERO: 1.0000 "application " | INHALATION_SPRAY | CUTANEOUS | Status: DC | PRN

## 2013-05-28 MED ORDER — FLEET ENEMA 7-19 GM/118ML RE ENEM: 1.0000 | ENEMA | Freq: Every day | RECTAL | Status: DC | PRN

## 2013-05-28 MED ORDER — TERBUTALINE SULFATE 1 MG/ML IJ SOLN: 0.2500 mg | Freq: Once | INTRAMUSCULAR | Status: DC | PRN

## 2013-05-28 MED ORDER — BISACODYL 10 MG RE SUPP: 10.0000 mg | Freq: Every day | RECTAL | Status: DC | PRN

## 2013-05-28 MED ORDER — LANOLIN HYDROUS EX OINT: TOPICAL_OINTMENT | CUTANEOUS | Status: DC | PRN

## 2013-05-28 NOTE — Anesthesia Postprocedure Evaluation (Signed)
  Anesthesia Post-op Note  Patient: Anita Beck  Procedure(s) Performed: * No procedures listed *  Patient Location: PACU and Mother/Baby  Anesthesia Type:Epidural  Level of Consciousness: awake, alert  and oriented  Airway and Oxygen Therapy: Patient Spontanous Breathing  Post-op Pain: none  Post-op Assessment: Post-op Vital signs reviewed, Patient's Cardiovascular Status Stable, No headache, No backache, No residual numbness and No residual motor weakness  Post-op Vital Signs: Reviewed and stable  Complications: No apparent anesthesia complications

## 2013-05-28 NOTE — H&P (Signed)
Anita Beck is a 27 y.o. female presenting at 38.6 with SOL.  Positive GBS. Maternal Medical History:  Reason for admission: Contractions.   Contractions: Onset was 3-5 hours ago.   Frequency: regular.   Perceived severity is moderate.    Fetal activity: Perceived fetal activity is normal.    Prenatal complications: History of preterm labor with first pregnancy was on 17 hydroxyprogesterone caproate.  Kidney stones.   Prenatal Complications - Diabetes: none.    OB History   Grav Para Term Preterm Abortions TAB SAB Ect Mult Living   2 1 0 1 0 0 0 0 0 1      Past Medical History  Diagnosis Date  . Hyperthyroidism   . Kidney stones   . Depression     pt was 27 yo-no longer medicated  . Asthma   . Migraines   . Complication of anesthesia    Past Surgical History  Procedure Laterality Date  . Cholecystectomy    . Appendectomy    . Parathyroidectomy  2010  . Lithotripsy     Family History: family history includes Diabetes in her father; Heart disease in her father and mother; and Hypertension in her father. Social History:  reports that she has never smoked. She does not have any smokeless tobacco history on file. She reports that she does not drink alcohol or use illicit drugs.   Prenatal Transfer Tool  Maternal Diabetes: No Genetic Screening: Normal Maternal Ultrasounds/Referrals: Normal Fetal Ultrasounds or other Referrals:  None Maternal Substance Abuse:  No Significant Maternal Medications:  None Significant Maternal Lab Results:  Lab values include: Group B Strep positive Other Comments:  None  ROS  Dilation: 6 Effacement (%): 80 Station: -1 Exam by:: [email or web address]-[email or web address], rnc Blood pressure 141/117, pulse 71, temperature 97.8 F (36.6 C), temperature source Oral, resp. rate 20, height 5\' 6"  (1.676 m), weight 114.306 kg (252 lb), SpO2 100.00%. Maternal Exam:  Uterine Assessment: Contraction strength is moderate.  Contraction frequency is regular.   Abdomen:  Patient reports no abdominal tenderness. Fundal height is c/w dates.   Estimated fetal weight is 7.   Fetal presentation: vertex  Pelvis: adequate for delivery.   Cervix: Cervix evaluated by digital exam.   6 cm and 80 % per nurse  Physical Exam  Prenatal labs: ABO, Rh: --/--/O POS (07/31 0025) Antibody: NEG (07/31 0025) Rubella:   RPR: Nonreactive (01/08 0000)  HBsAg:    HIV: Non-reactive (01/08 0000)  GBS: Positive (07/14 0000)   Assessment/Plan: IUP at term with SOL Positive GBS IV penicillin routine labor and delivery   Anita Beck 05/28/2013, 5:14 AM

## 2013-05-28 NOTE — Progress Notes (Signed)
Now 6/C/-1, AROM>>>clear AF, ctx q 5 min, will augment with pitocin per protocol

## 2013-05-29 LAB — CBC
HCT: 35.2 % — ABNORMAL LOW (ref 36.0–46.0)
Hemoglobin: 11.6 g/dL — ABNORMAL LOW (ref 12.0–15.0)
MCH: 27.5 pg (ref 26.0–34.0)
MCHC: 33 g/dL (ref 30.0–36.0)
MCV: 83.4 fL (ref 78.0–100.0)
Platelets: 179 10*3/uL (ref 150–400)
RBC: 4.22 MIL/uL (ref 3.87–5.11)
RDW: 15.2 % (ref 11.5–15.5)
WBC: 12.5 10*3/uL — ABNORMAL HIGH (ref 4.0–10.5)

## 2013-05-29 MED ORDER — IBUPROFEN 800 MG PO TABS: 800.0000 mg | ORAL_TABLET | Freq: Three times a day (TID) | ORAL | Status: DC | PRN

## 2013-05-29 MED ORDER — OXYCODONE-ACETAMINOPHEN 5-325 MG PO TABS: 1.0000 | ORAL_TABLET | Freq: Four times a day (QID) | ORAL | Status: DC | PRN

## 2013-05-29 MED ORDER — PNEUMOCOCCAL VAC POLYVALENT 25 MCG/0.5ML IJ INJ
0.5000 mL | INJECTION | Freq: Once | INTRAMUSCULAR | Status: AC
  Administered 2013-05-29: 0.5 mL via INTRAMUSCULAR
  Filled 2013-05-29: qty 0.5

## 2013-05-29 NOTE — Discharge Summary (Signed)
Obstetric Discharge Summary Reason for Admission: onset of labor Prenatal Procedures: ultrasound Intrapartum Procedures: spontaneous vaginal delivery Postpartum Procedures: none Complications-Operative and Postpartum: none Hemoglobin  Date Value Range Status  05/29/2013 11.6* 12.0 - 15.0 g/dL Final     HCT  Date Value Range Status  05/29/2013 35.2* 36.0 - 46.0 % Final    Physical Exam:  General: alert and cooperative Lochia: appropriate Uterine Fundus: firm Incision: perineum intact DVT Evaluation: No evidence of DVT seen on physical exam. Negative Homan's sign. No cords or calf tenderness.  Discharge Diagnoses: Term Pregnancy-delivered  Discharge Information: Date: 05/29/2013 Activity: pelvic rest Diet: routine Medications: PNV, Ibuprofen and Percocet Condition: stable Instructions: refer to practice specific booklet Discharge to: home   Newborn Data: Live born female  Birth Weight: 7 lb 10.6 oz (3476 g) APGAR: 9, 9  Home with mother.  CURTIS,CAROL G 05/29/2013, 8:37 AM

## 2013-07-04 ENCOUNTER — Inpatient Hospital Stay (HOSPITAL_COMMUNITY)
Admission: AD | Admit: 2013-07-04 | Discharge: 2013-07-04 | Disposition: A | Discharge status: Home / Self Care | Payer: BC Managed Care – PPO | Source: Ambulatory Visit | Attending: Obstetrics and Gynecology | Admitting: Obstetrics and Gynecology

## 2013-07-04 DIAGNOSIS — N938 Other specified abnormal uterine and vaginal bleeding: Secondary | ICD-10-CM | POA: Insufficient documentation

## 2013-07-04 DIAGNOSIS — N939 Abnormal uterine and vaginal bleeding, unspecified: Secondary | ICD-10-CM

## 2013-07-04 DIAGNOSIS — N949 Unspecified condition associated with female genital organs and menstrual cycle: Secondary | ICD-10-CM | POA: Insufficient documentation

## 2013-07-04 DIAGNOSIS — N898 Other specified noninflammatory disorders of vagina: Secondary | ICD-10-CM

## 2013-07-04 LAB — CBC
HCT: 40.2 % (ref 36.0–46.0)
Hemoglobin: 13.2 g/dL (ref 12.0–15.0)
MCH: 27.1 pg (ref 26.0–34.0)
MCHC: 32.8 g/dL (ref 30.0–36.0)
MCV: 82.5 fL (ref 78.0–100.0)
Platelets: 259 10*3/uL (ref 150–400)
RBC: 4.87 MIL/uL (ref 3.87–5.11)
RDW: 15 % (ref 11.5–15.5)
WBC: 8.9 10*3/uL (ref 4.0–10.5)

## 2013-07-04 MED ORDER — METHYLERGONOVINE MALEATE 0.2 MG PO TABS: 0.2000 mg | ORAL_TABLET | Freq: Three times a day (TID) | ORAL | Status: DC

## 2013-07-04 MED ORDER — IBUPROFEN 600 MG PO TABS: 600.0000 mg | ORAL_TABLET | Freq: Four times a day (QID) | ORAL | Status: DC | PRN

## 2013-07-04 NOTE — MAU Provider Note (Signed)
History     CSN: [id number]  Arrival date and time: 07/04/13 1238   First Provider Initiated Contact with Patient 07/04/13 1326      Chief Complaint  Patient presents with  . Vaginal Bleeding   HPI  [name] is a 27 y.o. female G2P1102; post partum, delivered July 31st. Vaginal delivery, presents with heavy vaginal bleeding. She was seen in the office on Thursday by Dr. Rana Snare for the same complaint and had an US done that showed a pocket of blood in her uterus. Dr. Rana Snare put in a catheter and drained the pocket of blood. She continues to experience heavy vaginal bleeding; has soaked 4-5 pads since 8 am this morning. She is not breast feeding. She is unsure if this is her menstrual cycle because she has never stopped bleeding since delivery. She denies HA, dizziness. She does complain of weakness. The patient denies intercourse since delivery.   OB History   Grav Para Term Preterm Abortions TAB SAB Ect Mult Living   2 2 1 1  0 0 0 0 0 2      Past Medical History  Diagnosis Date  . Hyperthyroidism   . Kidney stones   . Depression     pt was 27 yo-no longer medicated  . Asthma   . Migraines   . Complication of anesthesia     Past Surgical History  Procedure Laterality Date  . Cholecystectomy    . Appendectomy    . Parathyroidectomy  2010  . Lithotripsy      Family History  Problem Relation Age of Onset  . Hypertension Father   . Diabetes Father   . Heart disease Father   . Heart disease Mother     History  Substance Use Topics  . Smoking status: Never Smoker   . Smokeless tobacco: Not on file  . Alcohol Use: No    Allergies:  Allergies  Allergen Reactions  . Aspirin Anaphylaxis    Prescriptions prior to admission  Medication Sig Dispense Refill  . ibuprofen (ADVIL,MOTRIN) 800 MG tablet Take 1 tablet (800 mg total) by mouth every 8 (eight) hours as needed.  30 tablet  1  . oxyCODONE-acetaminophen (PERCOCET/ROXICET) 5-325 MG per tablet Take 1-2  tablets by mouth every 6 (six) hours as needed.  30 tablet  0  . Prenatal Vit-Fe Fumarate-FA (PRENATAL MULTIVITAMIN) TABS Take 1 tablet by mouth at bedtime.       Results for orders placed during the hospital encounter of 07/04/13 (from the past 24 hour(s))  CBC     Status: None   Collection Time    07/04/13  1:33 PM      Result Value Range   WBC 8.9  4.0 - 10.5 K/uL   RBC 4.87  3.87 - 5.11 MIL/uL   Hemoglobin 13.2  12.0 - 15.0 g/dL   HCT 40.9  81.1 - 91.4 %   MCV 82.5  78.0 - 100.0 fL   MCH 27.1  26.0 - 34.0 pg   MCHC 32.8  30.0 - 36.0 g/dL   RDW 78.2  95.6 - 21.3 %   Platelets 259  150 - 400 K/uL    Review of Systems  Constitutional: Negative for fever and chills.  Gastrointestinal: Negative for nausea, vomiting, abdominal pain, diarrhea and constipation.  Genitourinary: Negative for dysuria, urgency and frequency.  Neurological: Positive for weakness. Negative for dizziness and headaches.   Physical Exam   Blood pressure 133/85, pulse 122, temperature 98.3 F (  36.8 C), temperature source Oral, resp. rate 18, height 5\' 6"  (1.676 m), weight 112.583 kg (248 lb 3.2 oz), unknown if currently breastfeeding.  Physical Exam  Constitutional: She is oriented to person, place, and time. She appears well-developed and well-nourished. No distress.  HENT:  Head: Normocephalic.  Neck: Neck supple.  GI: Soft.  Genitourinary:  Speculum exam: Vagina - Moderate amount of dark red blood in vaginal canal; one small clot noted, no odor Cervix -small amount of dark red blood at cervical os  Chaperone present for exam.   Musculoskeletal: Normal range of motion.  Neurological: She is alert and oriented to person, place, and time.  Skin: Skin is warm. She is not diaphoretic.    MAU Course  Procedures  MDM CBC  Speculum exam   Consulted with Dr. Marcelle Overlie regarding patients complaints of heavy vaginal bleeding. Orders received   Assessment and Plan  A: Abnormal vaginal bleeding    P: Discharge home RX: Methergine 0.2 mg tabs Q8 PO hours X 6 doses        Ibuprofen 600 mg Q6 PO hours PRN pain with food.  Call Dr. Dennie Bible office on Monday Morning to schedule a follow up visit in the office Return to MAU with worsening symptoms; increased pain, bleeding Bleeding precautions discussed  Support given  [facility], JENNIFER IRENE FNP-C 07/04/2013, 2:35 PM

## 2013-07-04 NOTE — MAU Note (Signed)
Pt reports she delivered on July31/14. Has had bleeding ever since. Saw Dr. Rana Snare on Thursday. Had U/S tht showed a pocket of blood and then he " suctioned blood uot with a catheter in his office and told her she should only have spotting. Woke up today and had a lot of vag bleeding with clots.

## 2013-07-04 NOTE — MAU Note (Signed)
Patient presents to MAU s/p VD on 7/31 with c/o vaginal bleeding. Reports she was seen at office on Thursday for same problem. Reports Hgb 13.7 at that time.  Patient reports she is saturating one pad per hour, with heavy clotting. Denies any medications, none in last 24 hrs.

## 2013-07-07 IMAGING — US US OB FOLLOW-UP
2 series · 12 of 28 positions shown · non-contrast
Comparison: none

[Series 1: us ob follow up · 2 of 8 slices shown]
[im 3/8]
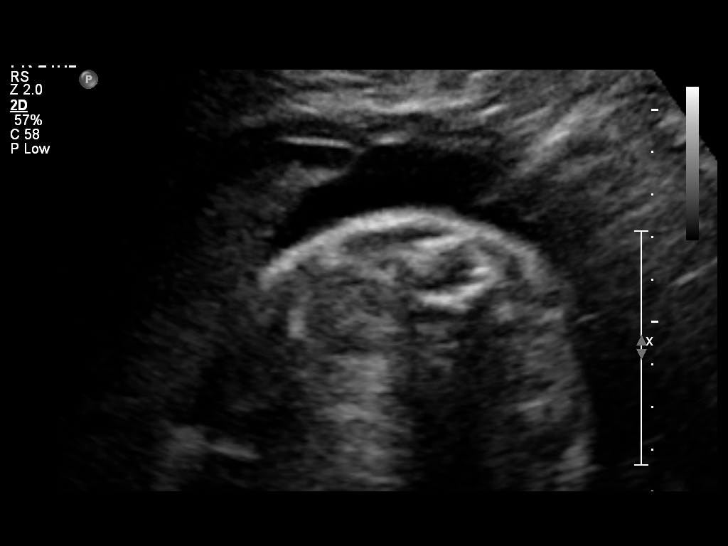
[im 8/8]
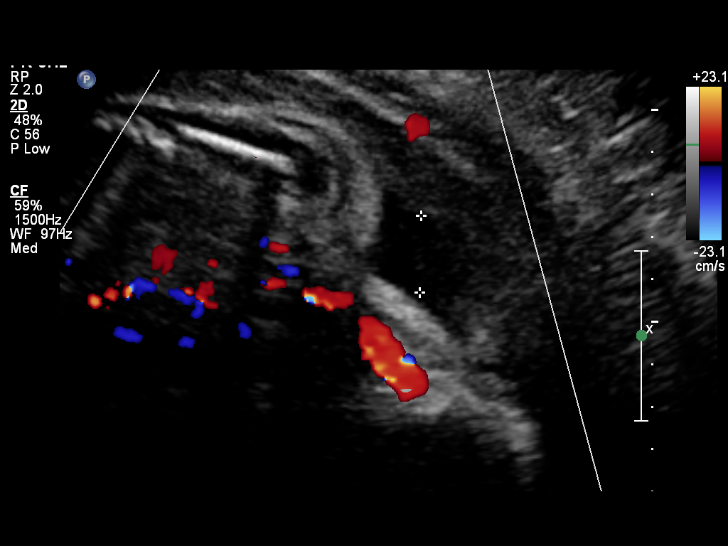

[Series 1: us ob comp +14 wk · 49 acquisitions, 10 frames shown]
[im 3/49]
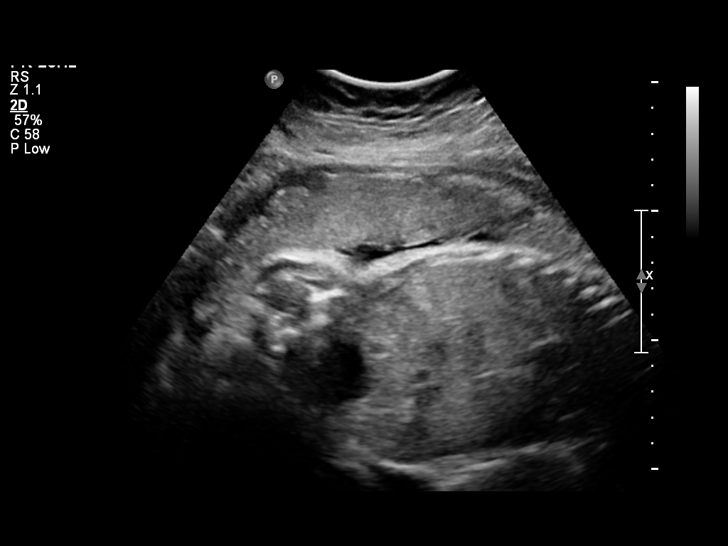
[im 9/49]
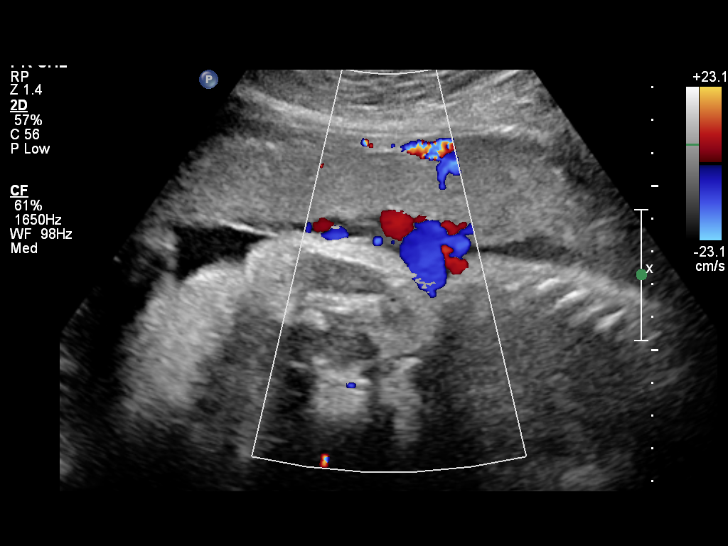
[im 13/49]
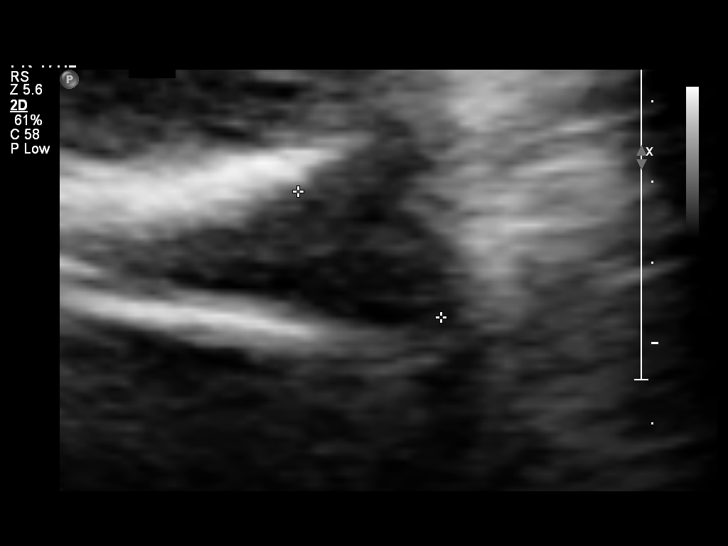
[im 17/49]
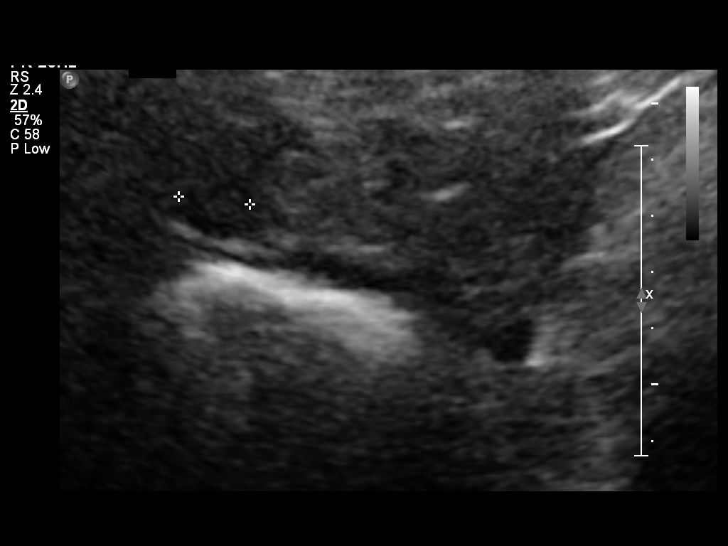
[im 23/49]
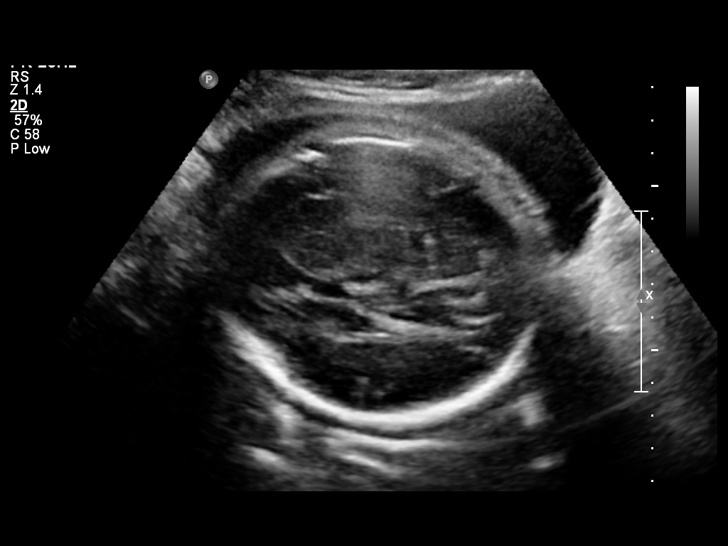
[im 28/49]
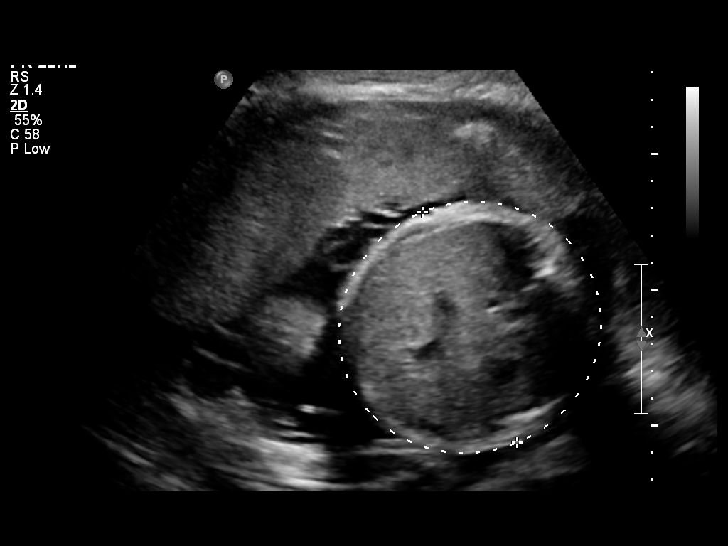
[im 32/49]
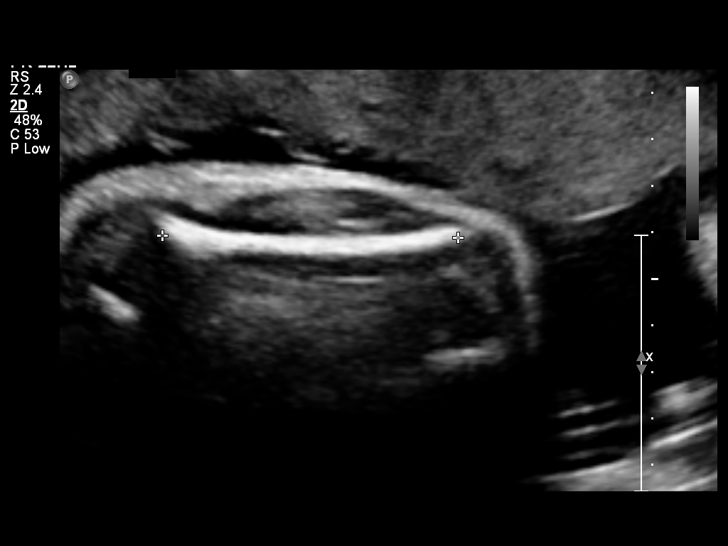
[im 38/49]
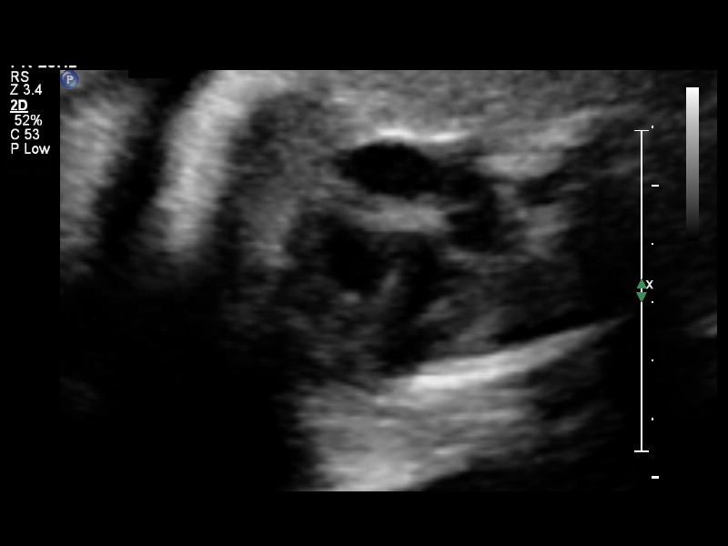
[im 42/49]
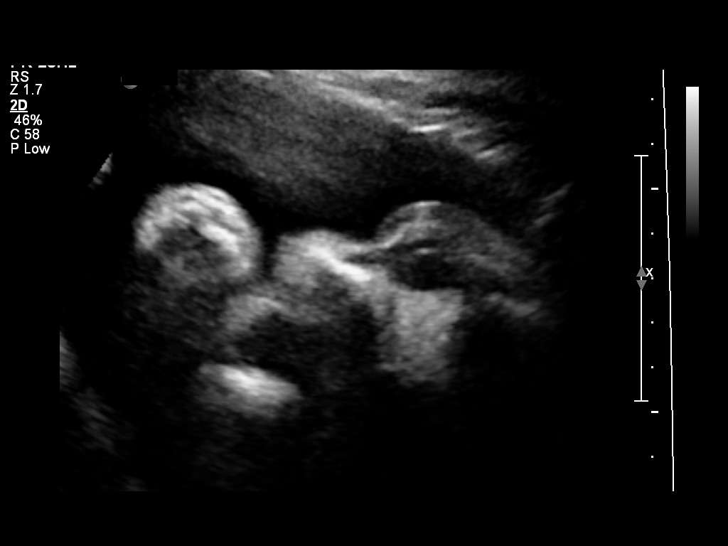
[im 46/49]
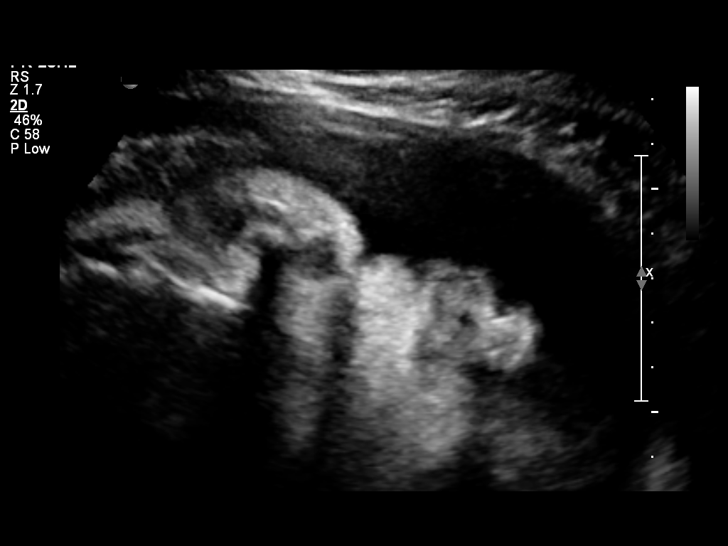

[12 of 28 positions shown; findings below may reference images not displayed]

OBSTETRICS REPORT
                      (Signed Final 09/24/2011 [DATE])

 Order#:         [id number]
Procedures

 US OB FOLLOW UP                                       76816.1
Indications

 Size greater than dates (Large for gestational [AGE]
 Pain - Abdominal/Pelvic
 Depression
 Hyperparathyroidism
 Hirsutism
 Increased BMI
Fetal Evaluation

 Fetal Heart Rate:  143                         bpm
 Cardiac Activity:  Observed
 Presentation:      Cephalic
 Placenta:          Anterior, above cervical os
 P. Cord            Visualized
 Insertion:

 Amniotic Fluid
 AFI FV:      Subjectively within normal limits
 AFI Sum:     17.13   cm      63   %Tile     Larg Pckt:   6.48   cm
 RUQ:   6.48   cm    RLQ:    4.97   cm    LUQ:   1.8     cm   LLQ:    3.88   cm
Biometry

 BPD:       83  mm    G. Age:   33w 3d                CI:         70.9   70 - 86
                                                      FL/HC:      20.4   19.1 -

 HC:     314.1  mm    G. Age:   35w 1d       92  %    HC/AC:      1.05   0.96 -

 AC:     299.3  mm    G. Age:   34w 0d       93  %    FL/BPD:     77.1   71 - 87
 FL:        64  mm    G. Age:   33w 1d       70  %    FL/AC:      21.4   20 - 24

 Est. FW:    2237  gm          5 lb      83  %
Gestational Age

 U/S Today:     33w 6d                                        EDD:   11/06/11
 Best:          31w 6d    Det. By:   Previous Ultrasound      EDD:   11/20/11
Anatomy
 Cranium:           Appears normal      Aortic Arch:       Previously seen
 Fetal Cavum:       Appears normal      Ductal Arch:       Not well
                                                           visualized
 Ventricles:        Appears normal      Diaphragm:         Appears normal
 Choroid Plexus:    Previously seen     Stomach:           Appears
                                                           normal, left
                                                           sided
 Cerebellum:        Previously seen     Abdomen:           Appears normal
 Posterior Fossa:   Previously seen     Abdominal Wall:    Previously seen
 Nuchal Fold:       Previously seen     Cord Vessels:      Previously seen
 Face:              Lips and orbits     Kidneys:           Appear normal
                    appear normal
 Heart:             Appears normal      Bladder:           Appears normal
                    (4 chamber &
                    axis)
 RVOT:              Previously seen     Spine:             Previously seen
 LVOT:              Previously seen     Limbs:             Previously seen

 Other:     5th digit prev visualized. Technically difficult due to
            advanced GA and fetal position.
Cervix Uterus Adnexa

 Cervical Length:   3         cm

 Cervix:       Normal appearance by transabdominal scan.

 Adnexa:     No abnormality visualized.
Impression

 Assigned GA is currently 31w 6d.   Upward trend of fetal
 growth curve, with EFW currently at  83 %ile.
 Amniotic fluid within normal limits, with AFI of 17.13 cm.
Recommendations

 Continued US follow-up of fetal growth in 4 weeks.

 questions or concerns.

## 2013-09-03 ENCOUNTER — Other Ambulatory Visit: Payer: Self-pay

## 2014-02-11 ENCOUNTER — Other Ambulatory Visit: Payer: Self-pay | Admitting: Family

## 2014-02-11 DIAGNOSIS — M546 Pain in thoracic spine: Secondary | ICD-10-CM

## 2014-02-21 ENCOUNTER — Other Ambulatory Visit: Payer: BC Managed Care – PPO

## 2014-08-30 ENCOUNTER — Encounter (HOSPITAL_COMMUNITY): Payer: Self-pay | Admitting: *Deleted

## 2014-08-31 ENCOUNTER — Other Ambulatory Visit: Payer: Self-pay | Admitting: Obstetrics and Gynecology

## 2014-09-01 LAB — CYTOLOGY - PAP

## 2014-11-17 ENCOUNTER — Other Ambulatory Visit: Payer: Self-pay | Admitting: Rehabilitation

## 2014-11-17 DIAGNOSIS — M5417 Radiculopathy, lumbosacral region: Secondary | ICD-10-CM

## 2014-11-17 DIAGNOSIS — M545 Low back pain, unspecified: Secondary | ICD-10-CM

## 2014-11-17 DIAGNOSIS — M79604 Pain in right leg: Secondary | ICD-10-CM

## 2014-11-18 ENCOUNTER — Ambulatory Visit
Admission: RE | Admit: 2014-11-18 | Discharge: 2014-11-18 | Disposition: A | Discharge status: Home / Self Care | Payer: BLUE CROSS/BLUE SHIELD | Source: Ambulatory Visit | Attending: Rehabilitation | Admitting: Rehabilitation

## 2014-11-18 DIAGNOSIS — M79604 Pain in right leg: Secondary | ICD-10-CM

## 2014-11-18 DIAGNOSIS — M545 Low back pain: Secondary | ICD-10-CM

## 2014-11-18 DIAGNOSIS — M5417 Radiculopathy, lumbosacral region: Secondary | ICD-10-CM

## 2014-11-20 ENCOUNTER — Other Ambulatory Visit: Payer: Self-pay

## 2015-03-05 NOTE — Telephone Encounter (Signed)
error 

## 2015-05-31 ENCOUNTER — Other Ambulatory Visit: Payer: Self-pay | Admitting: Urology

## 2015-05-31 ENCOUNTER — Encounter (HOSPITAL_COMMUNITY): Payer: Self-pay | Admitting: *Deleted

## 2015-06-01 NOTE — Progress Notes (Signed)
Requested labs from [facility] .  Left message for [employer].

## 2015-06-02 ENCOUNTER — Ambulatory Visit (HOSPITAL_COMMUNITY): Payer: BLUE CROSS/BLUE SHIELD | Admitting: Certified Registered"

## 2015-06-02 ENCOUNTER — Encounter (HOSPITAL_COMMUNITY): Payer: Self-pay | Admitting: *Deleted

## 2015-06-02 ENCOUNTER — Encounter (HOSPITAL_COMMUNITY)
Admission: RE | Disposition: A | Discharge status: Home / Self Care | Payer: Self-pay | Source: Ambulatory Visit | Attending: Urology

## 2015-06-02 ENCOUNTER — Ambulatory Visit (HOSPITAL_COMMUNITY)
Admission: RE | Admit: 2015-06-02 | Discharge: 2015-06-02 | Disposition: A | Discharge status: Home / Self Care | Payer: BLUE CROSS/BLUE SHIELD | Source: Ambulatory Visit | Attending: Urology | Admitting: Urology

## 2015-06-02 DIAGNOSIS — D351 Benign neoplasm of parathyroid gland: Secondary | ICD-10-CM | POA: Diagnosis not present

## 2015-06-02 DIAGNOSIS — N202 Calculus of kidney with calculus of ureter: Secondary | ICD-10-CM | POA: Diagnosis not present

## 2015-06-02 DIAGNOSIS — E0590 Thyrotoxicosis, unspecified without thyrotoxic crisis or storm: Secondary | ICD-10-CM | POA: Diagnosis not present

## 2015-06-02 DIAGNOSIS — N201 Calculus of ureter: Secondary | ICD-10-CM | POA: Diagnosis present

## 2015-06-02 HISTORY — DX: Other specified postprocedural states: Z98.890

## 2015-06-02 HISTORY — PX: CYSTOSCOPY WITH RETROGRADE PYELOGRAM, URETEROSCOPY AND STENT PLACEMENT: SHX5789

## 2015-06-02 HISTORY — PX: HOLMIUM LASER APPLICATION: SHX5852

## 2015-06-02 HISTORY — DX: Other specified postprocedural states: R11.2

## 2015-06-02 LAB — CBC
HCT: 44.3 % (ref 36.0–46.0)
Hemoglobin: 14.7 g/dL (ref 12.0–15.0)
MCH: 29.3 pg (ref 26.0–34.0)
MCHC: 33.2 g/dL (ref 30.0–36.0)
MCV: 88.4 fL (ref 78.0–100.0)
Platelets: 155 10*3/uL (ref 150–400)
RBC: 5.01 MIL/uL (ref 3.87–5.11)
RDW: 13.7 % (ref 11.5–15.5)
WBC: 7.6 10*3/uL (ref 4.0–10.5)

## 2015-06-02 LAB — HCG, SERUM, QUALITATIVE: Preg, Serum: NEGATIVE

## 2015-06-02 SURGERY — CYSTOURETEROSCOPY, WITH RETROGRADE PYELOGRAM AND STENT INSERTION
Anesthesia: General | Laterality: Left

## 2015-06-02 MED ORDER — DEXTROSE 5 % IV SOLN
5.0000 mg/kg | INTRAVENOUS | Status: AC
  Administered 2015-06-02: 400 mg via INTRAVENOUS
  Filled 2015-06-02: qty 10

## 2015-06-02 MED ORDER — FENTANYL CITRATE (PF) 100 MCG/2ML IJ SOLN
INTRAMUSCULAR | Status: AC
  Filled 2015-06-02: qty 4

## 2015-06-02 MED ORDER — MIDAZOLAM HCL 2 MG/2ML IJ SOLN
INTRAMUSCULAR | Status: AC
  Filled 2015-06-02: qty 4

## 2015-06-02 MED ORDER — SENNOSIDES-DOCUSATE SODIUM 8.6-50 MG PO TABS: 1.0000 | ORAL_TABLET | Freq: Two times a day (BID) | ORAL | Status: DC

## 2015-06-02 MED ORDER — ONDANSETRON HCL 4 MG/2ML IJ SOLN
INTRAMUSCULAR | Status: DC | PRN
  Administered 2015-06-02: 4 mg via INTRAVENOUS

## 2015-06-02 MED ORDER — LACTATED RINGERS IV SOLN
INTRAVENOUS | Status: DC | PRN
  Administered 2015-06-02: 17:00:00 via INTRAVENOUS

## 2015-06-02 MED ORDER — DIPHENHYDRAMINE HCL 50 MG/ML IJ SOLN
INTRAMUSCULAR | Status: AC
  Filled 2015-06-02: qty 1

## 2015-06-02 MED ORDER — OXYCODONE-ACETAMINOPHEN 5-325 MG PO TABS
1.0000 | ORAL_TABLET | Freq: Once | ORAL | Status: AC
  Administered 2015-06-02: 1 via ORAL
  Filled 2015-06-02: qty 1

## 2015-06-02 MED ORDER — 0.9 % SODIUM CHLORIDE (POUR BTL) OPTIME
TOPICAL | Status: DC | PRN
Start: 2015-06-02 — End: 2015-06-02
  Administered 2015-06-02: 1000 mL

## 2015-06-02 MED ORDER — SODIUM CHLORIDE 0.9 % IR SOLN
Status: DC | PRN
  Administered 2015-06-02: 1

## 2015-06-02 MED ORDER — LIDOCAINE HCL (CARDIAC) 20 MG/ML IV SOLN
INTRAVENOUS | Status: DC | PRN
  Administered 2015-06-02: 20 mg via INTRAVENOUS

## 2015-06-02 MED ORDER — LACTATED RINGERS IV SOLN: INTRAVENOUS | Status: DC

## 2015-06-02 MED ORDER — FENTANYL CITRATE (PF) 100 MCG/2ML IJ SOLN
25.0000 ug | INTRAMUSCULAR | Status: DC | PRN
  Administered 2015-06-02: 50 ug via INTRAVENOUS

## 2015-06-02 MED ORDER — PROPOFOL 10 MG/ML IV BOLUS
INTRAVENOUS | Status: AC
  Filled 2015-06-02: qty 20

## 2015-06-02 MED ORDER — GENTAMICIN IN SALINE 1.6-0.9 MG/ML-% IV SOLN: 80.0000 mg | INTRAVENOUS | Status: DC

## 2015-06-02 MED ORDER — OXYCODONE-ACETAMINOPHEN 10-325 MG PO TABS: 1.0000 | ORAL_TABLET | ORAL | Status: DC | PRN

## 2015-06-02 MED ORDER — METOCLOPRAMIDE HCL 5 MG/ML IJ SOLN
INTRAMUSCULAR | Status: AC
  Filled 2015-06-02: qty 2

## 2015-06-02 MED ORDER — PROMETHAZINE HCL 25 MG/ML IJ SOLN: 6.2500 mg | INTRAMUSCULAR | Status: DC | PRN

## 2015-06-02 MED ORDER — IOHEXOL 300 MG/ML  SOLN
INTRAMUSCULAR | Status: DC | PRN
  Administered 2015-06-02: 13 mL via INTRAVENOUS

## 2015-06-02 MED ORDER — FENTANYL CITRATE (PF) 100 MCG/2ML IJ SOLN
INTRAMUSCULAR | Status: AC
  Filled 2015-06-02: qty 2

## 2015-06-02 MED ORDER — MIDAZOLAM HCL 5 MG/5ML IJ SOLN
INTRAMUSCULAR | Status: DC | PRN
  Administered 2015-06-02: 2 mg via INTRAVENOUS

## 2015-06-02 MED ORDER — METOCLOPRAMIDE HCL 5 MG/ML IJ SOLN
INTRAMUSCULAR | Status: DC | PRN
  Administered 2015-06-02: 10 mg via INTRAVENOUS

## 2015-06-02 MED ORDER — DEXAMETHASONE SODIUM PHOSPHATE 10 MG/ML IJ SOLN
INTRAMUSCULAR | Status: DC | PRN
  Administered 2015-06-02: 10 mg via INTRAVENOUS

## 2015-06-02 MED ORDER — ONDANSETRON HCL 4 MG/2ML IJ SOLN
INTRAMUSCULAR | Status: AC
  Filled 2015-06-02: qty 2

## 2015-06-02 MED ORDER — CEPHALEXIN 500 MG PO CAPS: 500.0000 mg | ORAL_CAPSULE | Freq: Two times a day (BID) | ORAL | Status: DC

## 2015-06-02 MED ORDER — DEXAMETHASONE SODIUM PHOSPHATE 10 MG/ML IJ SOLN
INTRAMUSCULAR | Status: AC
  Filled 2015-06-02: qty 1

## 2015-06-02 MED ORDER — MEPERIDINE HCL 50 MG/ML IJ SOLN
INTRAMUSCULAR | Status: AC
  Filled 2015-06-02: qty 1

## 2015-06-02 MED ORDER — FENTANYL CITRATE (PF) 100 MCG/2ML IJ SOLN
INTRAMUSCULAR | Status: DC | PRN
  Administered 2015-06-02 (×2): 50 ug via INTRAVENOUS

## 2015-06-02 MED ORDER — DIPHENHYDRAMINE HCL 50 MG/ML IJ SOLN
12.5000 mg | Freq: Once | INTRAMUSCULAR | Status: AC
  Administered 2015-06-02: 12.5 mg via INTRAVENOUS

## 2015-06-02 MED ORDER — PROPOFOL 10 MG/ML IV BOLUS
INTRAVENOUS | Status: DC | PRN
  Administered 2015-06-02: 150 mg via INTRAVENOUS

## 2015-06-02 MED ORDER — MEPERIDINE HCL 50 MG/ML IJ SOLN
6.2500 mg | INTRAMUSCULAR | Status: DC | PRN
  Administered 2015-06-02: 12.5 mg via INTRAVENOUS

## 2015-06-02 SURGICAL SUPPLY — 23 items
BASKET LASER NITINOL 1.9FR (BASKET) ×3 IMPLANT
BASKET STNLS GEMINI 4WIRE 3FR (BASKET) IMPLANT
BASKET ZERO TIP NITINOL 2.4FR (BASKET) IMPLANT
CATH INTERMIT  6FR 70CM (CATHETERS) ×3 IMPLANT
CLOTH BEACON ORANGE TIMEOUT ST (SAFETY) ×3 IMPLANT
ELECT REM PT RETURN 9FT ADLT (ELECTROSURGICAL)
ELECTRODE REM PT RTRN 9FT ADLT (ELECTROSURGICAL) IMPLANT
FIBER LASER FLEXIVA 1000 (UROLOGICAL SUPPLIES) IMPLANT
FIBER LASER FLEXIVA 200 (UROLOGICAL SUPPLIES) ×3 IMPLANT
FIBER LASER FLEXIVA 365 (UROLOGICAL SUPPLIES) IMPLANT
FIBER LASER FLEXIVA 550 (UROLOGICAL SUPPLIES) IMPLANT
FIBER LASER TRAC TIP (UROLOGICAL SUPPLIES) IMPLANT
GLOVE BIOGEL M STRL SZ7.5 (GLOVE) ×3 IMPLANT
GOWN STRL REUS W/TWL XL LVL3 (GOWN DISPOSABLE) ×6 IMPLANT
GUIDEWIRE ANG ZIPWIRE 038X150 (WIRE) ×3 IMPLANT
GUIDEWIRE STR DUAL SENSOR (WIRE) ×3 IMPLANT
IV NS IRRIG 3000ML ARTHROMATIC (IV SOLUTION) ×3 IMPLANT
PACK CYSTO (CUSTOM PROCEDURE TRAY) ×3 IMPLANT
SHEATH ACCESS URETERAL 24CM (SHEATH) ×3 IMPLANT
STENT POLARIS 5FRX24 (STENTS) ×3 IMPLANT
SYRINGE 10CC LL (SYRINGE) IMPLANT
SYRINGE IRR TOOMEY STRL 70CC (SYRINGE) IMPLANT
TUBE FEEDING 8FR 16IN STR KANG (MISCELLANEOUS) ×3 IMPLANT

## 2015-06-02 NOTE — Brief Op Note (Signed)
06/02/2015  5:38 PM  PATIENT:  Anita Beck  29 y.o. female  PRE-OPERATIVE DIAGNOSIS:  LEFT URETERAL STONE  POST-OPERATIVE DIAGNOSIS:  LEFT URETERAL STONE  PROCEDURE:  Procedure(s): CYSTOSCOPY WITH RETROGRADE PYELOGRAM, URETEROSCOPY AND STENT PLACEMENT (Left) WITH HOLMIUM LASER LITHOTRIPSY (Left)  SURGEON:  Surgeon(s) and Role:    * Alexis Frock, MD - Primary  PHYSICIAN ASSISTANT:   ASSISTANTS: none   ANESTHESIA:   general  EBL:     BLOOD ADMINISTERED:none  DRAINS: none   LOCAL MEDICATIONS USED:  NONE  SPECIMEN:  Source of Specimen:  Left Ureteral Stone  DISPOSITION OF SPECIMEN:  [facility] for compositional analysis  COUNTS:  YES  TOURNIQUET:  * No tourniquets in log *  DICTATION: .Other Dictation: Dictation Number [id number]  PLAN OF CARE: Discharge to home after PACU  PATIENT DISPOSITION:  PACU - hemodynamically stable.   Delay start of Pharmacological VTE agent (>24hrs) due to surgical blood loss or risk of bleeding: yes

## 2015-06-02 NOTE — Transfer of Care (Signed)
Immediate Anesthesia Transfer of Care Note  Patient: Anita Beck  Procedure(s) Performed: Procedure(s): CYSTOSCOPY WITH RETROGRADE PYELOGRAM, URETEROSCOPY AND STENT PLACEMENT (Left) WITH HOLMIUM LASER LITHOTRIPSY (Left)  Patient Location: PACU  Anesthesia Type:General  Level of Consciousness:  sedated, patient cooperative and responds to stimulation  Airway & Oxygen Therapy:Patient Spontanous Breathing and Patient connected to face mask oxgen  Post-op Assessment:  Report given to PACU RN and Post -op Vital signs reviewed and stable  Post vital signs:  Reviewed and stable  Last Vitals:  Filed Vitals:   06/02/15 1538  BP: 142/90  Pulse: 90  Temp: 36.7 C  Resp: 20    Complications: No apparent anesthesia complications

## 2015-06-02 NOTE — Anesthesia Procedure Notes (Signed)
Procedure Name: LMA Insertion Date/Time: 06/02/2015 5:01 PM Performed by: Lajuana Carry E Pre-anesthesia Checklist: Patient identified, Emergency Drugs available, Suction available and Patient being monitored Patient Re-evaluated:Patient Re-evaluated prior to inductionOxygen Delivery Method: Circle system utilized Preoxygenation: Pre-oxygenation with 100% oxygen Intubation Type: IV induction Ventilation: Mask ventilation without difficulty LMA: LMA inserted LMA Size: 4.0 Number of attempts: 1 Placement Confirmation: positive ETCO2 and breath sounds checked- equal and bilateral

## 2015-06-02 NOTE — Anesthesia Preprocedure Evaluation (Addendum)
Anesthesia Evaluation  Patient identified by MRN, date of birth, ID band Patient awake    Reviewed: Allergy & Precautions, NPO status , Patient's Chart, lab work & pertinent test results  History of Anesthesia Complications (+) PONV and history of anesthetic complications  Airway Mallampati: II  TM Distance: >3 FB Neck ROM: Full    Dental no notable dental hx.    Pulmonary asthma ,  breath sounds clear to auscultation  Pulmonary exam normal       Cardiovascular negative cardio ROS Normal cardiovascular examRhythm:Regular Rate:Normal     Neuro/Psych  Headaches, PSYCHIATRIC DISORDERS Depression    GI/Hepatic negative GI ROS, Neg liver ROS,   Endo/Other  Primary hyperparathyroidism.  Renal/GU Renal disease  negative genitourinary   Musculoskeletal negative musculoskeletal ROS (+)   Abdominal (+) + obese,   Peds negative pediatric ROS (+)  Hematology negative hematology ROS (+)   Anesthesia Other Findings   Reproductive/Obstetrics negative OB ROS                             Anesthesia Physical Anesthesia Plan  ASA: II  Anesthesia Plan: General   Post-op Pain Management:    Induction: Intravenous  Airway Management Planned: LMA  Additional Equipment:   Intra-op Plan:   Post-operative Plan: Extubation in OR  Informed Consent: I have reviewed the patients History and Physical, chart, labs and discussed the procedure including the risks, benefits and alternatives for the proposed anesthesia with the patient or authorized representative who has indicated his/her understanding and acceptance.   Dental advisory given  Plan Discussed with: CRNA  Anesthesia Plan Comments: (NPO; no nausea; plan LMA and reglan.)       Anesthesia Quick Evaluation

## 2015-06-02 NOTE — H&P (Signed)
Anita Beck is an 29 y.o. female.    Chief Complaint: Pre-OP Left Ureteroscopic Stone Manipulation  HPI:   1 - Recurrent Nephrolithiasis -  Pre 2016 - MET x several, SWL x2 05/2015 - CT with left 12m ureteral stone (just above iliacs), SSD 12cm, 1100 HU. Punctate bilateral renal as well.   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 Prior composition CaOx, CaPO4.   3 -Flank Pain - y left flank pain with radiation to groin, "feels like another stone". No fevers. CT with large left ureteral stone. NO fevers.   PMH sig for appy, chole, parathyroidectomy. No CV disesae. No blood thinners. Her PCP is CDonnetta SimpersMD.  Today "Anita Beck is seen to proceed with left ureteroscopic stone manipulaiton. Most recnet UCX scant non-clonal growth, no interval fervers or stone passage. Her uric acid was elevated at over 10 by most recent serum labs.   Past Medical History  Diagnosis Date  . Hyperthyroidism   . Kidney stones   . Depression     pt was 29 yo-no longer medicated  . Asthma   . Migraines   . Complication of anesthesia     slow to wake up , itching   . PONV (postoperative nausea and vomiting)     nausea only     Past Surgical History  Procedure Laterality Date  . Cholecystectomy    . Appendectomy    . Parathyroidectomy  2010  . Lithotripsy      Family History  Problem Relation Age of Onset  . Hypertension Father   . Diabetes Father   . Heart disease Father   . Heart disease Mother    Social History:  reports that she has never smoked. She has never used smokeless tobacco. She reports that she does not drink alcohol or use illicit drugs.  Allergies:  Allergies  Allergen Reactions  . Aspirin Anaphylaxis    No prescriptions prior to admission    No results found for this or any previous visit (from the past 445hour(s)). No results found.  Review of Systems  Constitutional: Negative.  Negative for fever  and chills.  HENT: Negative.   Eyes: Negative.   Respiratory: Negative.   Cardiovascular: Negative.   Gastrointestinal: Negative.   Genitourinary: Positive for flank pain.  Musculoskeletal: Negative.   Skin: Negative.   Neurological: Negative.   Endo/Heme/Allergies: Negative.   Psychiatric/Behavioral: Negative.     not currently breastfeeding. Physical Exam  Constitutional: She appears well-developed.  HENT:  Head: Normocephalic.  Eyes: Pupils are equal, round, and reactive to light.  Neck: Normal range of motion.  Cardiovascular: Normal rate.   Respiratory: Effort normal.  GI: Soft.  Genitourinary:  Mild left CVAT  Musculoskeletal: Normal range of motion.  Neurological: She is alert.  Skin: Skin is warm.  Psychiatric: She has a normal mood and affect. Her behavior is normal. Judgment and thought content normal.     Assessment/Plan   1 - Recurrent Nephrolithiasis -   We rediscussed ureteroscopic stone manipulation with basketing and laser-lithotripsy in detail.  We rediscussed risks including bleeding, infection, damage to kidney / ureter  bladder, rarely loss of kidney. We rediscussed anesthetic risks and rare but serious surgical complications including DVT, PE, MI, and mortality. We specifically readdressed that in 5-10% of cases a staged approach is required with stenting followed by re-attempt ureteroscopy if anatomy unfavorable.   The patient voiced understanding and wishes to proceed today as planned.  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 Will address at f/u visit.   3 -Flank Pain - left ureteral stone as per above.    Anita Beck 06/02/2015, 6:40 AM

## 2015-06-02 NOTE — Anesthesia Postprocedure Evaluation (Signed)
  Anesthesia Post-op Note  Patient: Anita Beck  Procedure(s) Performed: Procedure(s) (LRB): CYSTOSCOPY WITH RETROGRADE PYELOGRAM, URETEROSCOPY AND STENT PLACEMENT (Left) WITH HOLMIUM LASER LITHOTRIPSY (Left)  Patient Location: PACU  Anesthesia Type: general  Level of Consciousness: awake and alert   Airway and Oxygen Therapy: Patient Spontanous Breathing  Post-op Pain: mild  Post-op Assessment: Post-op Vital signs reviewed, Patient's Cardiovascular Status Stable, Respiratory Function Stable, Patent Airway and No signs of Nausea or vomiting  Last Vitals:  Filed Vitals:   06/02/15 1929  BP: 138/72  Pulse: 87  Temp: 36.8 C  Resp: 16    Post-op Vital Signs: stable   Complications: No apparent anesthesia complications

## 2015-06-02 NOTE — Discharge Instructions (Signed)
1 - You may have urinary urgency (bladder spasms) and bloody urine on / off with stent in place. This is normal.  2 - Call MD or go to ER for fever >102, severe pain / nausea / vomiting not relieved by medications, or acute change in medical status  3 - Remove tethered stent on Friday around noon at home by pulling on string, the blue-white plastic tubing, and discarding.      General Anesthesia, Care After Refer to this sheet in the next few weeks. These instructions provide you with information on caring for yourself after your procedure. Your health care provider may also give you more specific instructions. Your treatment has been planned according to current medical practices, but problems sometimes occur. Call your health care provider if you have any problems or questions after your procedure. WHAT TO EXPECT AFTER THE PROCEDURE After the procedure, it is typical to experience:  Sleepiness.  Nausea and vomiting. HOME CARE INSTRUCTIONS  For the first 24 hours after general anesthesia:  Have a responsible person with you.  Do not drive a car. If you are alone, do not take public transportation.  Do not drink alcohol.  Do not take medicine that has not been prescribed by your health care provider.  Do not sign important papers or make important decisions.  You may resume a normal diet and activities as directed by your health care provider.  Change bandages (dressings) as directed.  If you have questions or problems that seem related to general anesthesia, call the hospital and ask for the anesthetist or anesthesiologist on call. SEEK MEDICAL CARE IF:  You have nausea and vomiting that continue the day after anesthesia.  You develop a rash. SEEK IMMEDIATE MEDICAL CARE IF:   You have difficulty breathing.  You have chest pain.  You have any allergic problems. Document Released: 01/21/2001 Document Revised: 10/20/2013 Document Reviewed: 04/30/2013 [facility] Patient  Information 2015 [place], [place]. This information is not intended to replace advice given to you by your health care provider. Make sure you discuss any questions you have with your health care provider.

## 2015-06-03 ENCOUNTER — Encounter (HOSPITAL_COMMUNITY): Payer: Self-pay | Admitting: Urology

## 2015-06-03 NOTE — Op Note (Signed)
NAMEJAZMINA, Anita Beck               ACCOUNT NO.:  [id number]  MEDICAL RECORD NO.:  [id number]  LOCATION:  [facility]                         FACILITY:  [facility]  PHYSICIAN:  Anita Frock, MD     DATE OF BIRTH:  November 22, 1985  DATE OF PROCEDURE: 06/02/2015                              OPERATIVE REPORT  DIAGNOSIS:  Left ureteral stone with refractory colic.  PROCEDURES: 1. Cystoscopy with left retrograde pyelogram interpretation. 2. Left ureteroscopy with laser lithotripsy. 3. Insertion of left ureteral stent, 5 x 26 Polaris with tether.  ESTIMATED BLOOD LOSS:  Nil.  COMPLICATIONS:  None.  SPECIMEN:  Left ureteral stone for compositional analysis.  FINDINGS: 1. Unremarkable urinary bladder. 2. Filling defect in the distal ureter with mild hydroureteronephrosis     above this, consistent with known stone. 3. Large distal ureteral stone as expected. 4. Multifocal small volume intrarenal stones. 5. Complete resolution of all stone fragments, larger than 1/3rd mm     following laser lithotripsy and basket extraction.  INDICATIONS:  Anita Beck is a pleasant 29 year old lady with history of recurrent nephrolithiasis as well as hyperparathyroidism.  She has, since her hyperparathyroidism addressed, her stone frequency has drastically decreased.  Unfortunately, she presented to the office with left flank pain, consistent with recurrent nephrolithiasis and CT scan corroborated a large distal left ureteral stone and bilateral punctate renal stones.  Options were discussed for management including continued medical therapy versus shockwave lithotripsy versus ureteroscopy, and she adamantly wishes to proceed with the latter.  Informed consent was obtained and placed in the medical record.  PROCEDURE IN DETAIL:  The patient being Anita Beck verified. Procedure being left ureteroscopic stone manipulation was confirmed. Procedure was carried out.  Time-out was performed.  Intravenous antibiotics  administered.  General LMA anesthesia was introduced.  The patient was placed into low lithotomy position.  Sterile field was created by prepping and draping the patient's vagina, introitus, and proximal thighs using iodine x3.  Next, cystourethroscopy was performed using 23-French rigid cystoscope with 30-degree offset lens.  Inspection of the urinary bladder revealed no diverticula, calcifications, papular lesions.  Ureteral orifices appeared singleton bilaterally.  The left ureteral orifice was cannulated with 6-French end-hole catheter and left retrograde pyelogram was obtained.  Left retrograde pyelogram demonstrated a single left ureter with single system left kidney.  There was a large filling defect in the distal ureter, consistent with known stone, this was off void.  There was mild hydroureteronephrosis above this.  A 0.038 zip wire was advanced at the level of the upper pole and set aside as a safety wire.  An 8-French feeding tube placed in the urinary bladder for pressure release.  Next, semi-rigid ureteroscopy was performed from the distal left ureter alongside a separate Sensor working wire.  The stone in question was indeed encountered in the left distal ureter, appeared to be much too large for simple basketing.  As such, holmium laser energy was applied to the stone using settings of 0.2 joules and 30 Hz, fragmenting the stone approximately 6 smaller pieces.  These were then sequentially grasped on their long axis with an escape basket, brought out in their entirety, set aside for compositional analysis.  Repeat ureteroscopic examination with semi-rigid ureteroscope allowed inspection of the distal 2/3rd of left ureter.  No additional calcifications were noted. As the goal today was ipsilateral stone free, the semi-rigid ureteroscope was exchanged for a 24 cm 12/14 ureteral access sheath at the level of proximal ureter over the Sensor working wire.  Next, flexible  digital ureteroscopy was performed in the left proximal ureter and systematic inspection of the left kidney x2 using a single channel flexible digital ureteroscope.  There were several punctate calcifications noted, mostly in the upper pole and lower pole.  These appeared to be 1 to 2 mm or less in diameter.  Holmium laser energy was used to ablate the stones into fragments less than 1/3rd mm.  The access sheath was removed under continuous ureteroscopic vision and a new 5 x 26 Polaris type stent was placed with remaining safety wire using fluoroscopic guidance.  Good proximal and distal deployment were noted. Tether was left in place and fashioned to the thigh.  Then, the procedure was terminated.  The patient tolerated the procedure well. There were no immediate periprocedural complications.  The patient was taken to the postanesthesia care unit in stable condition.          ______________________________ Anita Frock, MD     TM/MEDQ  D:  06/02/2015  T:  06/03/2015  Job:  [id number]

## 2015-10-30 NOTE — L&D Delivery Note (Signed)
Delivery Note At 1:01 PM a viable female was delivered via Vaginal, Spontaneous Delivery (PresentationOA, loose nuchal X 1>>reduced,: ;  ).  APGAR: , ; weight  .   Placenta status:spont>>intact, , .  Cord:  with the following complications: .  Cord pH: not sent  Anesthesia:  epid Episiotomy:none   Lacerations:  none Suture Repair: NA Est. Blood Loss (mL):    Mom to postpartum.  Baby to Couplet care / Skin to Skin.  Anita Beck 08/20/2016, 1:09 PM

## 2016-01-16 LAB — OB RESULTS CONSOLE ABO/RH: RH Type: POSITIVE

## 2016-01-16 LAB — OB RESULTS CONSOLE HEPATITIS B SURFACE ANTIGEN: Hepatitis B Surface Ag: NEGATIVE

## 2016-01-16 LAB — OB RESULTS CONSOLE ANTIBODY SCREEN: Antibody Screen: NEGATIVE

## 2016-01-16 LAB — OB RESULTS CONSOLE GC/CHLAMYDIA
Chlamydia: NEGATIVE
Gonorrhea: NEGATIVE

## 2016-01-16 LAB — OB RESULTS CONSOLE RUBELLA ANTIBODY, IGM: Rubella: IMMUNE

## 2016-01-16 LAB — OB RESULTS CONSOLE HIV ANTIBODY (ROUTINE TESTING): HIV: NONREACTIVE

## 2016-01-16 LAB — OB RESULTS CONSOLE RPR: RPR: NONREACTIVE

## 2016-07-26 ENCOUNTER — Inpatient Hospital Stay (HOSPITAL_COMMUNITY)
Admission: AD | Admit: 2016-07-26 | Discharge: 2016-07-26 | Disposition: A | Discharge status: Home / Self Care | Payer: BLUE CROSS/BLUE SHIELD | Source: Ambulatory Visit | Attending: Obstetrics and Gynecology | Admitting: Obstetrics and Gynecology

## 2016-07-26 ENCOUNTER — Encounter (HOSPITAL_COMMUNITY): Payer: Self-pay

## 2016-07-26 ENCOUNTER — Inpatient Hospital Stay (HOSPITAL_COMMUNITY): Payer: BLUE CROSS/BLUE SHIELD

## 2016-07-26 DIAGNOSIS — W19XXXA Unspecified fall, initial encounter: Secondary | ICD-10-CM | POA: Diagnosis not present

## 2016-07-26 DIAGNOSIS — Y9301 Activity, walking, marching and hiking: Secondary | ICD-10-CM | POA: Diagnosis not present

## 2016-07-26 DIAGNOSIS — S99912A Unspecified injury of left ankle, initial encounter: Secondary | ICD-10-CM | POA: Diagnosis not present

## 2016-07-26 DIAGNOSIS — Z3A35 35 weeks gestation of pregnancy: Secondary | ICD-10-CM | POA: Diagnosis not present

## 2016-07-26 DIAGNOSIS — S99812A Other specified injuries of left ankle, initial encounter: Secondary | ICD-10-CM | POA: Diagnosis not present

## 2016-07-26 DIAGNOSIS — O26893 Other specified pregnancy related conditions, third trimester: Secondary | ICD-10-CM | POA: Diagnosis not present

## 2016-07-26 DIAGNOSIS — O9A213 Injury, poisoning and certain other consequences of external causes complicating pregnancy, third trimester: Secondary | ICD-10-CM | POA: Diagnosis not present

## 2016-07-26 DIAGNOSIS — W010XXA Fall on same level from slipping, tripping and stumbling without subsequent striking against object, initial encounter: Secondary | ICD-10-CM | POA: Diagnosis not present

## 2016-07-26 DIAGNOSIS — IMO0002 Reserved for concepts with insufficient information to code with codable children: Secondary | ICD-10-CM

## 2016-07-26 MED ORDER — PROMETHAZINE HCL 25 MG/ML IJ SOLN
25.0000 mg | Freq: Once | INTRAMUSCULAR | Status: AC
  Administered 2016-07-26: 25 mg via INTRAMUSCULAR
  Filled 2016-07-26: qty 1

## 2016-07-26 MED ORDER — HYDROMORPHONE HCL 2 MG PO TABS: 1.0000 mg | ORAL_TABLET | ORAL | 0 refills | Status: DC | PRN

## 2016-07-26 MED ORDER — HYDROMORPHONE HCL 1 MG/ML IJ SOLN
1.0000 mg | Freq: Once | INTRAMUSCULAR | Status: AC
  Administered 2016-07-26: 1 mg via INTRAMUSCULAR
  Filled 2016-07-26: qty 1

## 2016-07-26 NOTE — MAU Note (Signed)
Pt states she was walking outside around 4pm and she stepped into a hole in the ground and fell. Pt states she did not hit her belly when she fell. Pt c/o some mild cramping since this happened. Pt denies bleeding and leaking of fluid. Pt states baby is moving normally. Pt c/o twisting her left ankle that is now painful and she can't stand on it.

## 2016-07-26 NOTE — MAU Provider Note (Signed)
Chief Complaint:  Fall   First Provider Initiated Contact with Patient 07/26/16 1817     HPI: Anita Beck is a 30 y.o. [id number] at 8w4dwho presents to maternity admissions reporting fall today.  Was walking and stepped in hold and everted left ankle.  Also fell but did not hit abdomen.Reubin Milan bear weight on left foot  + swelling. She reports good fetal movement, denies LOF, vaginal bleeding, vaginal itching/burning, urinary symptoms, h/a, dizziness, n/v, diarrhea, constipation or fever/chills.  Has mild contractions "all the time"   Fall  The accident occurred 3 to 6 hours ago. The fall occurred while walking. She fell from a height of 1 to 2 ft. She landed on grass. There was no blood loss. The point of impact was the left foot. The pain is present in the left foot. The pain is severe. The symptoms are aggravated by ambulation, standing, use of injured limb and pressure on injury. Pertinent negatives include no abdominal pain, fever, headaches, nausea, tingling or vomiting. She has tried nothing for the symptoms.   RN Note: Pt states she was walking outside around 4pm and she stepped into a hole in the ground and fell. Pt states she did not hit her belly when she fell. Pt c/o some mild cramping since this happened. Pt denies bleeding and leaking of fluid. Pt states baby is moving normally. Pt c/o twisting her left ankle that is now painful and she can't stand on it.  Past Medical History: Past Medical History:  Diagnosis Date  . Asthma   . Complication of anesthesia    slow to wake up , itching   . Depression    pt was 30 yo-no longer medicated  . Hyperthyroidism   . Kidney stones   . Migraines   . PONV (postoperative nausea and vomiting)    nausea only     Past obstetric history: OB History  Gravida Para Term Preterm AB Living  3 2 1 1  0 2  SAB TAB Ectopic Multiple Live Births  0 0 0 0 2    # Outcome Date GA Lbr Len/2nd Weight Sex Delivery Anes PTL Lv  3 Current            2 Term 05/28/13 [redacted]w[redacted]d 27:20 / 00:14  F Vag-Spont EPI  LIV  1 Preterm 10/23/11 [redacted]w[redacted]d 18:03 / 01:27 7 lb 6.3 oz (3.355 kg) F Vag-Spont EPI  LIV     Birth Comments: none      Past Surgical History: Past Surgical History:  Procedure Laterality Date  . APPENDECTOMY    . CHOLECYSTECTOMY    . CYSTOSCOPY WITH RETROGRADE PYELOGRAM, URETEROSCOPY AND STENT PLACEMENT Left 06/02/2015   Procedure: CYSTOSCOPY WITH RETROGRADE PYELOGRAM, URETEROSCOPY AND STENT PLACEMENT;  Surgeon: Alexis Frock, MD;  Location: [facility];  Service: Urology;  Laterality: Left;  . HOLMIUM LASER APPLICATION Left [id number]   Procedure: WITH HOLMIUM LASER LITHOTRIPSY;  Surgeon: Alexis Frock, MD;  Location: [facility];  Service: Urology;  Laterality: Left;  . LITHOTRIPSY    . PARATHYROIDECTOMY  2010    Family History: Family History  Problem Relation Age of Onset  . Hypertension Father   . Diabetes Father   . Heart disease Father   . Heart disease Mother     Social History: Social History  Substance Use Topics  . Smoking status: Never Smoker  . Smokeless tobacco: Never Used  . Alcohol use No    Allergies:  Allergies  Allergen Reactions  . Aspirin Anaphylaxis  Meds:  Prescriptions Prior to Admission  Medication Sig Dispense Refill Last Dose  . cephALEXin (KEFLEX) 500 MG capsule Take 1 capsule (500 mg total) by mouth 2 (two) times daily. X 2 days to prevent post-op infection 4 capsule 0   . levonorgestrel (MIRENA) 20 MCG/24HR IUD 1 each by Intrauterine route once.   09/01/2013  . ondansetron (ZOFRAN) 4 MG tablet Take 4 mg by mouth every 8 (eight) hours as needed for nausea or vomiting.   06/02/2015 at 1200  . oxyCODONE-acetaminophen (PERCOCET) 10-325 MG per tablet Take 1 tablet by mouth every 4 (four) hours as needed for pain. Post-operatively 15 tablet 0   . senna-docusate (SENOKOT-S) 8.6-50 MG per tablet Take 1 tablet by mouth 2 (two) times daily. While taking pain meds to prevent constipation 30 tablet 0     I  have reviewed patient's Past Medical Hx, Surgical Hx, Family Hx, Social Hx, medications and allergies.   ROS:  Review of Systems  Constitutional: Negative for chills and fever.  Cardiovascular: Positive for leg swelling (Left ankle).  Gastrointestinal: Negative for abdominal pain, constipation, diarrhea, nausea and vomiting.  Genitourinary: Negative for dysuria, pelvic pain, vaginal bleeding and vaginal discharge.  Neurological: Negative for tingling and headaches.   Other systems negative  Physical Exam  Patient Vitals for the past 24 hrs:  BP Temp Temp src Pulse Resp SpO2 Height Weight  07/26/16 1831 - - - 103 - 100 % - -  07/26/16 1826 - - - 103 - 99 % - -  07/26/16 1824 - - - 98 - 100 % - -  07/26/16 1816 - - - 97 - 99 % - -  07/26/16 1810 134/85 98.7 F (37.1 C) Oral 108 18 - 5\' 6"  (1.676 m) 223 lb (101.2 kg)   Constitutional: Well-developed, well-nourished female in no acute distress.  Cardiovascular: normal rate and rhythm Respiratory: normal effort, clear to auscultation bilaterally GI: Abd soft, non-tender, gravid appropriate for gestational age.   No rebound or guarding. MS: Extremities nontender on right,  Very tender on left, with edema on lateral aspect and more edema on lateral aspect.    ROM limited on left.  Good capillary refill on toes bilaterally. Neurologic: Alert and oriented x 4.  GU: Neg CVAT.    FHT:  Baseline 145 , moderate variability, accelerations present, no decelerations Contractions: q 3-10 mins Irregular    Labs: No results found for this or any previous visit (from the past 24 hour(s)).  Imaging:  Dg Ankle 2 Views Left  Result Date: 07/26/2016 CLINICAL DATA:  6 year old who stepped in a hole while outside and sustained a twisting injury to the left ankle. Lateral pain and swelling. Initial encounter. Patient is approximately [redacted] weeks pregnant. EXAM: LEFT ANKLE - 2 VIEW COMPARISON:  None. FINDINGS: Patient was shielded for the examination.  Marked lateral soft tissue swelling. No evidence of acute fracture or dislocation. Ankle mortise intact with well-preserved joint space. Bone mineral density well-preserved. Small plantar calcaneal spur. No other intrinsic osseous abnormalities. Note made of phleboliths in the subcutaneous tissues of the anterior lower leg. IMPRESSION: No acute osseous abnormality.  Small plantar calcaneal spur. Electronically Signed   By: Evangeline Dakin M.D.   On: 07/26/2016 18:59    MAU Course/MDM: I have ordered labs and reviewed results.  NST reviewed Consult Dr Corinna Capra with presentation, exam findings and test results.  Treatments in MAU included Fetal Monitoring, analgesia, BPP/AFI  During the second hour of EFM, had one deceleration  lasting 1.5 min so I ordered an [place] to measure BPP and AFI BPP 8/8       AFI normal 50%ile    Assessment: 1. Ankle injuries, left, initial encounter   2.     Probable Grade 2 sprain 3.      SIUP at [redacted]w[redacted]d 4.      Fetal heart rate deceleration >> Normal AFI and BPP 8/8, no evidence of abruption.    Plan: DC home Comfort measures reviewed  3rd Trimester precautions  PTL precautions  Fetal kick counts RX: dilaudid PRN #5  Return to MAU as needed FU with OB as planned  Follow-up Information    LOWE,DAVID C, MD .   Specialty:  Obstetrics and Gynecology Contact information: [place], [place] [address] [phone]            Hansel Feinstein CNM, MSN Certified Nurse-Midwife 07/26/2016 6:53 PM

## 2016-07-26 NOTE — Discharge Instructions (Signed)
What Do I Need to Know About Injuries During Pregnancy? Injuries can happen during pregnancy. Minor falls and accidents usually do not harm you or your baby. However, any injury should be reported to your doctor. WHAT CAN I DO TO PROTECT MYSELF FROM INJURIES?  Remove rugs and loose objects on the floor.  Wear comfortable shoes that have a good grip. Do not wear high-heeled shoes.  Always wear your seat belt. The lap belt should be below your belly. Always practice safe driving.  Do not ride on a motorcycle.  Do not participate in high-impact activities or sports.  Avoid:  Walking on wet or slippery floors.  Fires.  Starting fires.  Lifting heavy pots of boiling or hot liquids.  Fixing electrical problems.  Only take medicine as told by your doctor.  Know your blood type and the blood type of the baby's father.  Call your local emergency services (911 in the [place] if you are a victim of domestic violence or assault. For help and support, contact the [employer]. WHEN SHOULD I GET HELP RIGHT AWAY?  You fall on your belly or have any high-impact accident or injury.  You have been a victim of domestic violence or any kind of violence.  You have been in a car accident.  You have bleeding from your vagina.  Fluid is leaking from your vagina.  You start to have belly cramping (contractions) or pain.  You feel weak or pass out (faint).  You start to throw up (vomit) after an injury.  You have been burned.  You have a stiff neck or neck pain.  You get a headache or have vision problems after an injury.  You do not feel the baby move or the baby is not moving as much as normal.   This information is not intended to replace advice given to you by your health care provider. Make sure you discuss any questions you have with your health care provider.   Document Released: 11/17/2010 Document Revised: 11/05/2014 Document Reviewed:  07/22/2013 Elsevier Interactive Patient Education [employer].

## 2016-08-04 ENCOUNTER — Inpatient Hospital Stay (HOSPITAL_COMMUNITY)
Admission: AD | Admit: 2016-08-04 | Discharge: 2016-08-04 | Disposition: A | Discharge status: Home / Self Care | Payer: BLUE CROSS/BLUE SHIELD | Source: Ambulatory Visit | Attending: Obstetrics and Gynecology | Admitting: Obstetrics and Gynecology

## 2016-08-04 ENCOUNTER — Encounter (HOSPITAL_COMMUNITY): Payer: Self-pay | Admitting: *Deleted

## 2016-08-04 DIAGNOSIS — Z3A36 36 weeks gestation of pregnancy: Secondary | ICD-10-CM | POA: Diagnosis not present

## 2016-08-04 DIAGNOSIS — M545 Low back pain: Secondary | ICD-10-CM | POA: Diagnosis not present

## 2016-08-04 DIAGNOSIS — W101XXA Fall (on)(from) sidewalk curb, initial encounter: Secondary | ICD-10-CM

## 2016-08-04 DIAGNOSIS — W010XXA Fall on same level from slipping, tripping and stumbling without subsequent striking against object, initial encounter: Secondary | ICD-10-CM | POA: Diagnosis not present

## 2016-08-04 DIAGNOSIS — S3992XA Unspecified injury of lower back, initial encounter: Secondary | ICD-10-CM | POA: Diagnosis not present

## 2016-08-04 DIAGNOSIS — O9A213 Injury, poisoning and certain other consequences of external causes complicating pregnancy, third trimester: Secondary | ICD-10-CM | POA: Diagnosis not present

## 2016-08-04 DIAGNOSIS — O26893 Other specified pregnancy related conditions, third trimester: Secondary | ICD-10-CM | POA: Insufficient documentation

## 2016-08-04 NOTE — Discharge Instructions (Signed)
Fall Prevention in the Home  Falls can cause injuries and can affect people from all age groups. There are many simple things that you can do to make your home safe and to help prevent falls. WHAT CAN I DO ON THE OUTSIDE OF MY HOME?  Regularly repair the edges of walkways and driveways and fix any cracks.  Remove high doorway thresholds.  Trim any shrubbery on the main path into your home.  Use bright outdoor lighting.  Clear walkways of debris and clutter, including tools and rocks.  Regularly check that handrails are securely fastened and in good repair. Both sides of any steps should have handrails.  Install guardrails along the edges of any raised decks or porches.  Have leaves, snow, and ice cleared regularly.  Use sand or salt on walkways during winter months.  In the garage, clean up any spills right away, including grease or oil spills. WHAT CAN I DO IN THE BATHROOM?  Use night lights.  Install grab bars by the toilet and in the tub and shower. Do not use towel bars as grab bars.  Use non-skid mats or decals on the floor of the tub or shower.  If you need to sit down while you are in the shower, use a plastic, non-slip stool..  Keep the floor dry. Immediately clean up any water that spills on the floor.  Remove soap buildup in the tub or shower on a regular basis.  Attach bath mats securely with double-sided non-slip rug tape.  Remove throw rugs and other tripping hazards from the floor. WHAT CAN I DO IN THE BEDROOM?  Use night lights.  Make sure that a bedside light is easy to reach.  Do not use oversized bedding that drapes onto the floor.  Have a firm chair that has side arms to use for getting dressed.  Remove throw rugs and other tripping hazards from the floor. WHAT CAN I DO IN THE KITCHEN?   Clean up any spills right away.  Avoid walking on wet floors.  Place frequently used items in easy-to-reach places.  If you need to reach for something  above you, use a sturdy step stool that has a grab bar.  Keep electrical cables out of the way.  Do not use floor polish or wax that makes floors slippery. If you have to use wax, make sure that it is non-skid floor wax.  Remove throw rugs and other tripping hazards from the floor. WHAT CAN I DO IN THE STAIRWAYS?  Do not leave any items on the stairs.  Make sure that there are handrails on both sides of the stairs. Fix handrails that are broken or loose. Make sure that handrails are as long as the stairways.  Check any carpeting to make sure that it is firmly attached to the stairs. Fix any carpet that is loose or worn.  Avoid having throw rugs at the top or bottom of stairways, or secure the rugs with carpet tape to prevent them from moving.  Make sure that you have a light switch at the top of the stairs and the bottom of the stairs. If you do not have them, have them installed. WHAT ARE SOME OTHER FALL PREVENTION TIPS?  Wear closed-toe shoes that fit well and support your feet. Wear shoes that have rubber soles or low heels.  When you use a stepladder, make sure that it is completely opened and that the sides are firmly locked. Have someone hold the ladder while you   are using it. Do not climb a closed stepladder.  Add color or contrast paint or tape to grab bars and handrails in your home. Place contrasting color strips on the first and last steps.  Use mobility aids as needed, such as canes, walkers, scooters, and crutches.  Turn on lights if it is dark. Replace any light bulbs that burn out.  Set up furniture so that there are clear paths. Keep the furniture in the same spot.  Fix any uneven floor surfaces.  Choose a carpet design that does not hide the edge of steps of a stairway.  Be aware of any and all pets.  Review your medicines with your healthcare provider. Some medicines can cause dizziness or changes in blood pressure, which increase your risk of falling. Talk  with your health care provider about other ways that you can decrease your risk of falls. This may include working with a physical therapist or trainer to improve your strength, balance, and endurance.   This information is not intended to replace advice given to you by your health care provider. Make sure you discuss any questions you have with your health care provider.   Document Released: 10/05/2002 Document Revised: 03/01/2015 Document Reviewed: 11/19/2014 Elsevier Interactive Patient Education 2016 [employer] Inc[employer]  

## 2016-08-04 NOTE — MAU Provider Note (Signed)
History   [id number] was out with family and slipped off ridge in sidewalk and fell. Hitting the right side of her abd. She presents eith some low back pain and states is sore here she hit on her abd and thighs.  CSN: [id number]  Arrival date & time 08/04/16  1618   None     Chief Complaint  Patient presents with  . Fall    HPI  Past Medical History:  Diagnosis Date  . Asthma   . Complication of anesthesia    slow to wake up , itching   . Depression    pt was 30 yo-no longer medicated  . Hyperthyroidism   . Kidney stones   . Migraines   . PONV (postoperative nausea and vomiting)    nausea only     Past Surgical History:  Procedure Laterality Date  . APPENDECTOMY    . CHOLECYSTECTOMY    . CYSTOSCOPY WITH RETROGRADE PYELOGRAM, URETEROSCOPY AND STENT PLACEMENT Left 06/02/2015   Procedure: CYSTOSCOPY WITH RETROGRADE PYELOGRAM, URETEROSCOPY AND STENT PLACEMENT;  Surgeon: Alexis Frock, MD;  Location: [facility];  Service: Urology;  Laterality: Left;  . HOLMIUM LASER APPLICATION Left [id number]   Procedure: WITH HOLMIUM LASER LITHOTRIPSY;  Surgeon: Alexis Frock, MD;  Location: [facility];  Service: Urology;  Laterality: Left;  . LITHOTRIPSY    . PARATHYROIDECTOMY  2010    Family History  Problem Relation Age of Onset  . Hypertension Father   . Diabetes Father   . Heart disease Father   . Heart disease Mother     Social History  Substance Use Topics  . Smoking status: Never Smoker  . Smokeless tobacco: Never Used  . Alcohol use No    OB History    Gravida Para Term Preterm AB Living   3 2 1 1  0 2   SAB TAB Ectopic Multiple Live Births   0 0 0 0 2      Review of Systems  Constitutional: Negative.   HENT: Negative.   Eyes: Negative.   Respiratory: Negative.   Cardiovascular: Negative.   Gastrointestinal: Positive for abdominal pain.  Endocrine: Negative.   Genitourinary: Negative.   Musculoskeletal: Positive for back pain.  Skin: Negative.   Allergic/Immunologic:  Negative.   Neurological: Negative.   Hematological: Negative.   Psychiatric/Behavioral: Negative.     Allergies  Aspirin  Home Medications    There were no vitals taken for this visit.  Physical Exam  Constitutional: She is oriented to person, place, and time. She appears well-developed and well-nourished.  HENT:  Head: Normocephalic.  Eyes: Pupils are equal, round, and reactive to light.  Neck: Normal range of motion.  Cardiovascular: Normal rate, regular rhythm, normal heart sounds and intact distal pulses.   Pulmonary/Chest: Effort normal and breath sounds normal.  Abdominal: Soft. Bowel sounds are normal.  Musculoskeletal: Normal range of motion.  Neurological: She is alert and oriented to person, place, and time. She has normal reflexes.  Skin: Skin is warm and dry.  Psychiatric: She has a normal mood and affect. Her behavior is normal. Judgment and thought content normal.    MAU Course  Procedures (including critical care time)  Labs Reviewed - No data to display No results found.   No diagnosis found.    MDM  Dx: preg at 36.6 wks Fall on slippery surface P: monitor x 4 hrs. If fetus and mom remain stable will d/c home. Lengthy discussion on fall prevention. POC discussed with Dr. Julien Girt. Pt  to be d/c home.

## 2016-08-04 NOTE — MAU Note (Signed)
Pt presents via EMS with complaints of falling on handicap ramp and landing on the right side of her abdomen. Pt denies any vaginal bleeding or abnormal discharge. Reports a decrease in fetal movement

## 2016-08-14 ENCOUNTER — Telehealth (HOSPITAL_COMMUNITY): Payer: Self-pay | Admitting: *Deleted

## 2016-08-14 ENCOUNTER — Encounter (HOSPITAL_COMMUNITY): Payer: Self-pay | Admitting: *Deleted

## 2016-08-14 NOTE — Telephone Encounter (Signed)
Preadmission screen  

## 2016-08-16 ENCOUNTER — Telehealth (HOSPITAL_COMMUNITY): Payer: Self-pay | Admitting: *Deleted

## 2016-08-16 ENCOUNTER — Encounter (HOSPITAL_COMMUNITY): Payer: Self-pay | Admitting: *Deleted

## 2016-08-16 NOTE — Telephone Encounter (Signed)
Preadmission screen  

## 2016-08-20 ENCOUNTER — Inpatient Hospital Stay (HOSPITAL_COMMUNITY): Payer: BLUE CROSS/BLUE SHIELD | Admitting: Anesthesiology

## 2016-08-20 ENCOUNTER — Encounter (HOSPITAL_COMMUNITY): Payer: Self-pay

## 2016-08-20 ENCOUNTER — Inpatient Hospital Stay (HOSPITAL_COMMUNITY)
Admission: RE | Admit: 2016-08-20 | Discharge: 2016-08-22 | DRG: 767 | Disposition: A | Discharge status: Home / Self Care | Payer: BLUE CROSS/BLUE SHIELD | Source: Ambulatory Visit | Attending: Obstetrics and Gynecology | Admitting: Obstetrics and Gynecology

## 2016-08-20 DIAGNOSIS — Z833 Family history of diabetes mellitus: Secondary | ICD-10-CM | POA: Diagnosis not present

## 2016-08-20 DIAGNOSIS — O26893 Other specified pregnancy related conditions, third trimester: Secondary | ICD-10-CM | POA: Diagnosis present

## 2016-08-20 DIAGNOSIS — Z302 Encounter for sterilization: Secondary | ICD-10-CM | POA: Diagnosis not present

## 2016-08-20 DIAGNOSIS — Z3490 Encounter for supervision of normal pregnancy, unspecified, unspecified trimester: Secondary | ICD-10-CM

## 2016-08-20 DIAGNOSIS — Z3A39 39 weeks gestation of pregnancy: Secondary | ICD-10-CM | POA: Diagnosis not present

## 2016-08-20 DIAGNOSIS — Z8249 Family history of ischemic heart disease and other diseases of the circulatory system: Secondary | ICD-10-CM | POA: Diagnosis not present

## 2016-08-20 LAB — CBC
HCT: 36.9 % (ref 36.0–46.0)
Hemoglobin: 12.7 g/dL (ref 12.0–15.0)
MCH: 29.4 pg (ref 26.0–34.0)
MCHC: 34.4 g/dL (ref 30.0–36.0)
MCV: 85.4 fL (ref 78.0–100.0)
Platelets: 189 10*3/uL (ref 150–400)
RBC: 4.32 MIL/uL (ref 3.87–5.11)
RDW: 13.6 % (ref 11.5–15.5)
WBC: 7.9 10*3/uL (ref 4.0–10.5)

## 2016-08-20 LAB — TYPE AND SCREEN
ABO/RH(D): O POS
Antibody Screen: NEGATIVE

## 2016-08-20 LAB — RPR: RPR Ser Ql: NONREACTIVE

## 2016-08-20 MED ORDER — ONDANSETRON HCL 4 MG/2ML IJ SOLN: 4.0000 mg | Freq: Four times a day (QID) | INTRAMUSCULAR | Status: DC | PRN

## 2016-08-20 MED ORDER — PHENYLEPHRINE 40 MCG/ML (10ML) SYRINGE FOR IV PUSH (FOR BLOOD PRESSURE SUPPORT)
80.0000 ug | PREFILLED_SYRINGE | INTRAVENOUS | Status: DC | PRN
  Filled 2016-08-20: qty 5

## 2016-08-20 MED ORDER — DIPHENHYDRAMINE HCL 25 MG PO CAPS: 25.0000 mg | ORAL_CAPSULE | Freq: Four times a day (QID) | ORAL | Status: DC | PRN

## 2016-08-20 MED ORDER — LIDOCAINE HCL (PF) 1 % IJ SOLN
30.0000 mL | INTRAMUSCULAR | Status: DC | PRN
  Filled 2016-08-20: qty 30

## 2016-08-20 MED ORDER — MEASLES, MUMPS & RUBELLA VAC ~~LOC~~ INJ: 0.5000 mL | INJECTION | Freq: Once | SUBCUTANEOUS | Status: DC

## 2016-08-20 MED ORDER — FLEET ENEMA 7-19 GM/118ML RE ENEM: 1.0000 | ENEMA | RECTAL | Status: DC | PRN

## 2016-08-20 MED ORDER — DIBUCAINE 1 % RE OINT
1.0000 | TOPICAL_OINTMENT | RECTAL | Status: DC | PRN
Start: 2016-08-20 — End: 2016-08-21

## 2016-08-20 MED ORDER — ACETAMINOPHEN 325 MG PO TABS
650.0000 mg | ORAL_TABLET | ORAL | Status: DC | PRN
Start: 2016-08-20 — End: 2016-08-21
  Administered 2016-08-20: 650 mg via ORAL
  Filled 2016-08-20: qty 2

## 2016-08-20 MED ORDER — LACTATED RINGERS IV SOLN: 500.0000 mL | INTRAVENOUS | Status: DC | PRN

## 2016-08-20 MED ORDER — OXYTOCIN 40 UNITS IN LACTATED RINGERS INFUSION - SIMPLE MED
1.0000 m[IU]/min | INTRAVENOUS | Status: DC
  Administered 2016-08-20: 2 m[IU]/min via INTRAVENOUS
  Filled 2016-08-20: qty 1000

## 2016-08-20 MED ORDER — EPHEDRINE 5 MG/ML INJ
10.0000 mg | INTRAVENOUS | Status: DC | PRN
  Filled 2016-08-20: qty 4

## 2016-08-20 MED ORDER — EPHEDRINE 5 MG/ML INJ: 10.0000 mg | INTRAVENOUS | Status: DC | PRN

## 2016-08-20 MED ORDER — LACTATED RINGERS IV SOLN: 500.0000 mL | Freq: Once | INTRAVENOUS | Status: DC

## 2016-08-20 MED ORDER — SENNOSIDES-DOCUSATE SODIUM 8.6-50 MG PO TABS
2.0000 | ORAL_TABLET | ORAL | Status: DC
  Administered 2016-08-20: 2 via ORAL
  Filled 2016-08-20: qty 2

## 2016-08-20 MED ORDER — ONDANSETRON HCL 4 MG PO TABS: 4.0000 mg | ORAL_TABLET | ORAL | Status: DC | PRN

## 2016-08-20 MED ORDER — ZOLPIDEM TARTRATE 5 MG PO TABS: 5.0000 mg | ORAL_TABLET | Freq: Every evening | ORAL | Status: DC | PRN

## 2016-08-20 MED ORDER — BUTORPHANOL TARTRATE 1 MG/ML IJ SOLN: 1.0000 mg | INTRAMUSCULAR | Status: DC | PRN

## 2016-08-20 MED ORDER — TETANUS-DIPHTH-ACELL PERTUSSIS 5-2.5-18.5 LF-MCG/0.5 IM SUSP: 0.5000 mL | Freq: Once | INTRAMUSCULAR | Status: DC

## 2016-08-20 MED ORDER — ACETAMINOPHEN 325 MG PO TABS: 650.0000 mg | ORAL_TABLET | ORAL | Status: DC | PRN

## 2016-08-20 MED ORDER — OXYCODONE-ACETAMINOPHEN 5-325 MG PO TABS: 2.0000 | ORAL_TABLET | ORAL | Status: DC | PRN

## 2016-08-20 MED ORDER — OXYTOCIN BOLUS FROM INFUSION: 500.0000 mL | Freq: Once | INTRAVENOUS | Status: DC

## 2016-08-20 MED ORDER — OXYTOCIN 40 UNITS IN LACTATED RINGERS INFUSION - SIMPLE MED: 2.5000 [IU]/h | INTRAVENOUS | Status: DC

## 2016-08-20 MED ORDER — IBUPROFEN 800 MG PO TABS: 800.0000 mg | ORAL_TABLET | Freq: Three times a day (TID) | ORAL | Status: DC | PRN

## 2016-08-20 MED ORDER — ONDANSETRON HCL 4 MG/2ML IJ SOLN: 4.0000 mg | INTRAMUSCULAR | Status: DC | PRN

## 2016-08-20 MED ORDER — PHENYLEPHRINE 40 MCG/ML (10ML) SYRINGE FOR IV PUSH (FOR BLOOD PRESSURE SUPPORT): 80.0000 ug | PREFILLED_SYRINGE | INTRAVENOUS | Status: DC | PRN

## 2016-08-20 MED ORDER — WITCH HAZEL-GLYCERIN EX PADS: 1.0000 "application " | MEDICATED_PAD | CUTANEOUS | Status: DC | PRN

## 2016-08-20 MED ORDER — OXYCODONE-ACETAMINOPHEN 5-325 MG PO TABS: 1.0000 | ORAL_TABLET | ORAL | Status: DC | PRN

## 2016-08-20 MED ORDER — OXYTOCIN BOLUS FROM INFUSION
500.0000 mL | Freq: Once | INTRAVENOUS | Status: AC
  Administered 2016-08-20: 500 mL via INTRAVENOUS

## 2016-08-20 MED ORDER — SOD CITRATE-CITRIC ACID 500-334 MG/5ML PO SOLN: 30.0000 mL | ORAL | Status: DC | PRN

## 2016-08-20 MED ORDER — PENICILLIN G POTASSIUM 5000000 UNITS IJ SOLR
5.0000 10*6.[IU] | Freq: Once | INTRAVENOUS | Status: AC
  Administered 2016-08-20: 5 10*6.[IU] via INTRAVENOUS
  Filled 2016-08-20: qty 5

## 2016-08-20 MED ORDER — BENZOCAINE-MENTHOL 20-0.5 % EX AERO: 1.0000 "application " | INHALATION_SPRAY | CUTANEOUS | Status: DC | PRN

## 2016-08-20 MED ORDER — PRENATAL MULTIVITAMIN CH: 1.0000 | ORAL_TABLET | Freq: Every day | ORAL | Status: DC

## 2016-08-20 MED ORDER — FLEET ENEMA 7-19 GM/118ML RE ENEM: 1.0000 | ENEMA | Freq: Every day | RECTAL | Status: DC | PRN

## 2016-08-20 MED ORDER — DIPHENHYDRAMINE HCL 50 MG/ML IJ SOLN: 12.5000 mg | INTRAMUSCULAR | Status: DC | PRN

## 2016-08-20 MED ORDER — PHENYLEPHRINE 40 MCG/ML (10ML) SYRINGE FOR IV PUSH (FOR BLOOD PRESSURE SUPPORT)
80.0000 ug | PREFILLED_SYRINGE | INTRAVENOUS | Status: DC | PRN
  Filled 2016-08-20: qty 5
  Filled 2016-08-20: qty 10

## 2016-08-20 MED ORDER — LACTATED RINGERS IV SOLN
500.0000 mL | Freq: Once | INTRAVENOUS | Status: DC
Start: 2016-08-20 — End: 2016-08-20

## 2016-08-20 MED ORDER — LACTATED RINGERS IV SOLN
INTRAVENOUS | Status: DC
  Administered 2016-08-20: 125 mL/h via INTRAVENOUS

## 2016-08-20 MED ORDER — TERBUTALINE SULFATE 1 MG/ML IJ SOLN
0.2500 mg | Freq: Once | INTRAMUSCULAR | Status: DC | PRN
  Filled 2016-08-20: qty 1

## 2016-08-20 MED ORDER — COCONUT OIL OIL: 1.0000 "application " | TOPICAL_OIL | Status: DC | PRN

## 2016-08-20 MED ORDER — BISACODYL 10 MG RE SUPP: 10.0000 mg | Freq: Every day | RECTAL | Status: DC | PRN

## 2016-08-20 MED ORDER — SIMETHICONE 80 MG PO CHEW: 80.0000 mg | CHEWABLE_TABLET | ORAL | Status: DC | PRN

## 2016-08-20 MED ORDER — PENICILLIN G POTASSIUM 5000000 UNITS IJ SOLR
2.5000 10*6.[IU] | INTRAVENOUS | Status: DC
  Administered 2016-08-20: 2.5 10*6.[IU] via INTRAVENOUS
  Filled 2016-08-20 (×7): qty 2.5

## 2016-08-20 MED ORDER — FENTANYL 2.5 MCG/ML BUPIVACAINE 1/10 % EPIDURAL INFUSION (WH - ANES)
14.0000 mL/h | INTRAMUSCULAR | Status: DC | PRN
  Administered 2016-08-20: 14 mL/h via EPIDURAL
  Filled 2016-08-20: qty 125

## 2016-08-20 MED ORDER — LIDOCAINE HCL (PF) 1 % IJ SOLN
INTRAMUSCULAR | Status: DC | PRN
  Administered 2016-08-20: 4 mL via EPIDURAL

## 2016-08-20 MED ORDER — LACTATED RINGERS IV SOLN
500.0000 mL | INTRAVENOUS | Status: DC | PRN
  Administered 2016-08-20: 500 mL via INTRAVENOUS

## 2016-08-20 NOTE — H&P (Signed)
Anita Beck is a 30 y.o. female presenting for AROM IOL. OB History    Gravida Para Term Preterm AB Living   3 2 1 1  0 2   SAB TAB Ectopic Multiple Live Births   0 0 0 0 2     Past Medical History:  Diagnosis Date  . Anxiety   . Asthma   . Complication of anesthesia    slow to wake up , itching   . Depression    pt was 30 yo-no longer medicated, mild PP depression first child  . Hyperthyroidism   . Kidney stones   . Migraines   . PONV (postoperative nausea and vomiting)    nausea only    Past Surgical History:  Procedure Laterality Date  . APPENDECTOMY    . CHOLECYSTECTOMY    . CYSTOSCOPY WITH RETROGRADE PYELOGRAM, URETEROSCOPY AND STENT PLACEMENT Left 06/02/2015   Procedure: CYSTOSCOPY WITH RETROGRADE PYELOGRAM, URETEROSCOPY AND STENT PLACEMENT;  Surgeon: Alexis Frock, MD;  Location: [facility];  Service: Urology;  Laterality: Left;  . HOLMIUM LASER APPLICATION Left [id number]   Procedure: WITH HOLMIUM LASER LITHOTRIPSY;  Surgeon: Alexis Frock, MD;  Location: [facility];  Service: Urology;  Laterality: Left;  . LITHOTRIPSY    . PARATHYROIDECTOMY  2010   Family History: family history includes Cancer in her paternal grandfather and paternal grandmother; Diabetes in her father and paternal grandmother; Heart disease in her father, maternal grandmother, mother, and paternal grandmother; Hypertension in her father. Social History:  reports that she has never smoked. She has never used smokeless tobacco. She reports that she does not drink alcohol or use drugs.     Maternal Diabetes: No Genetic Screening: Normal Maternal Ultrasounds/Referrals: Normal Fetal Ultrasounds or other Referrals:  None Maternal Substance Abuse:  No Significant Maternal Medications:  None Significant Maternal Lab Results:  None Other Comments:  None  ROS Maternal Medical History:  Contractions: Frequency: irregular.   Perceived severity is mild.    Fetal activity: Perceived fetal activity is normal.    Last perceived fetal movement was within the past hour.      Dilation: 3 Effacement (%): 80 Station: -2 Exam by:: MD Daphnie Venturini Blood pressure (!) 144/74, pulse 91, temperature 97.7 F (36.5 C), temperature source Oral, resp. rate 18, height 5\' 6"  (1.676 m), weight 223 lb (101.2 kg). Maternal Exam:  Abdomen: Patient reports no abdominal tenderness. Fetal presentation: vertex     Physical Exam  Constitutional: She appears well-developed and well-nourished.  HENT:  Head: Atraumatic.  Neck: Normal range of motion. Neck supple.  Cardiovascular: Normal rate.   Respiratory: Breath sounds normal.  GI:  Term FH/FHR 142  Genitourinary:  Genitourinary Comments: 3+/80/vtx/-2    Prenatal labs: ABO, Rh: O/Positive/-- (03/20 0000) Antibody: Negative (03/20 0000) Rubella: Immune (03/20 0000) RPR: Nonreactive (03/20 0000)  HBsAg: Negative (03/20 0000)  HIV: Non-reactive (03/20 0000)  GBS:     Assessment/Plan: AROM IOL/ IV PCN for unkl GBS Wants PPTL   Anita Beck M 08/20/2016, 7:55 AM

## 2016-08-20 NOTE — Anesthesia Postprocedure Evaluation (Signed)
Anesthesia Post Note  Patient: Anita Beck  Procedure(s) Performed: * No procedures listed *  Patient location during evaluation: Mother Baby Anesthesia Type: Epidural Level of consciousness: awake and alert, oriented and patient cooperative Pain management: pain level controlled Vital Signs Assessment: post-procedure vital signs reviewed and stable Respiratory status: spontaneous breathing, nonlabored ventilation and respiratory function stable Cardiovascular status: stable Postop Assessment: no headache, no backache, patient able to bend at knees, no signs of nausea or vomiting and adequate PO intake Anesthetic complications: no     Last Vitals:  Vitals:   08/20/16 1430 08/20/16 1530  BP: (!) 127/56 (!) 129/53  Pulse: 70 75  Resp: 18 20  Temp: 36.4 C 36.8 C    Last Pain:  Vitals:   08/20/16 1830  TempSrc:   PainSc: 0-No pain   Pain Goal:                 Andrey Mccaskill

## 2016-08-20 NOTE — Anesthesia Procedure Notes (Signed)
Epidural Patient location during procedure: OB Start time: 08/20/2016 11:57 AM End time: 08/20/2016 12:03 PM  Staffing Anesthesiologist: Suella Broad D Performed: anesthesiologist   Preanesthetic Checklist Completed: patient identified, site marked, surgical consent, pre-op evaluation, timeout performed, IV checked, risks and benefits discussed and monitors and equipment checked  Epidural Patient position: sitting Prep: ChloraPrep Patient monitoring: heart rate, continuous pulse ox and blood pressure Approach: midline Location: L3-L4 Injection technique: LOR saline  Needle:  Needle type: Tuohy  Needle gauge: 17 G Needle length: 9 cm Catheter type: closed end flexible Catheter size: 20 Guage Test dose: negative and 1.5% lidocaine  Assessment Events: blood not aspirated, injection not painful, no injection resistance and no paresthesia  Additional Notes LOR @ 6  Patient identified. Risks/Benefits/Options discussed with patient including but not limited to bleeding, infection, nerve damage, paralysis, failed block, incomplete pain control, headache, blood pressure changes, nausea, vomiting, reactions to medications, itching and postpartum back pain. Confirmed with bedside nurse the patient's most recent platelet count. Confirmed with patient that they are not currently taking any anticoagulation, have any bleeding history or any family history of bleeding disorders. Patient expressed understanding and wished to proceed. All questions were answered. Sterile technique was used throughout the entire procedure. Please see nursing notes for vital signs. Test dose was given through epidural catheter and negative prior to continuing to dose epidural or start infusion. Warning signs of high block given to the patient including shortness of breath, tingling/numbness in hands, complete motor block, or any concerning symptoms with instructions to call for help. Patient was given instructions on  fall risk and not to get out of bed. All questions and concerns addressed with instructions to call with any issues or inadequate analgesia.    Reason for block:procedure for pain

## 2016-08-20 NOTE — Plan of Care (Signed)
Problem: Nutritional: Goal: Dietary intake will improve Patient NPO after midnight for BTL on 10/24

## 2016-08-20 NOTE — Anesthesia Pain Management Evaluation Note (Signed)
  CRNA Pain Management Visit Note  Patient: Anita Beck, 30 y.o., female  "Hello I am a member of the anesthesia team at [facility]. We have an anesthesia team available at all times to provide care throughout the hospital, including epidural management and anesthesia for C-section. I don't know your plan for the delivery whether it a natural birth, water birth, IV sedation, nitrous supplementation, doula or epidural, but we want to meet your pain goals."   1.Was your pain managed to your expectations on prior hospitalizations?   Yes   2.What is your expectation for pain management during this hospitalization?     Epidural  3.How can we help you reach that goal? eoidural  Record the patient's initial score and the patient's pain goal.   Pain: 4/10  Pain Goal: 0/10 [facility] wants you to be able to say your pain was always managed very well.  Ailene Ards 08/20/2016

## 2016-08-20 NOTE — Anesthesia Preprocedure Evaluation (Signed)
Anesthesia Evaluation  Patient identified by MRN, date of birth, ID band Patient awake    Reviewed: Allergy & Precautions, Patient's Chart, lab work & pertinent test results  History of Anesthesia Complications (+) PONV and history of anesthetic complications  Airway Mallampati: II       Dental   Pulmonary asthma ,    breath sounds clear to auscultation       Cardiovascular negative cardio ROS   Rhythm:Regular Rate:Normal     Neuro/Psych  Headaches, PSYCHIATRIC DISORDERS Anxiety Depression    GI/Hepatic negative GI ROS, Neg liver ROS,   Endo/Other  Hyperthyroidism   Renal/GU   negative genitourinary   Musculoskeletal negative musculoskeletal ROS (+)   Abdominal   Peds negative pediatric ROS (+)  Hematology negative hematology ROS (+)   Anesthesia Other Findings   Reproductive/Obstetrics (+) Pregnancy                             Lab Results  Component Value Date   WBC 7.9 08/20/2016   HGB 12.7 08/20/2016   HCT 36.9 08/20/2016   MCV 85.4 08/20/2016   PLT 189 08/20/2016   No results found for: INR, PROTIME   Anesthesia Physical Anesthesia Plan  ASA: II  Anesthesia Plan: Epidural   Post-op Pain Management:    Induction:   Airway Management Planned:   Additional Equipment:   Intra-op Plan:   Post-operative Plan:   Informed Consent: I have reviewed the patients History and Physical, chart, labs and discussed the procedure including the risks, benefits and alternatives for the proposed anesthesia with the patient or authorized representative who has indicated his/her understanding and acceptance.     Plan Discussed with:   Anesthesia Plan Comments:         Anesthesia Quick Evaluation

## 2016-08-20 NOTE — Progress Notes (Signed)
Adm for AROM induction>>now 3+/80/-2>>>AROM>>clr AF

## 2016-08-21 ENCOUNTER — Inpatient Hospital Stay (HOSPITAL_COMMUNITY): Payer: BLUE CROSS/BLUE SHIELD | Admitting: Certified Registered Nurse Anesthetist

## 2016-08-21 ENCOUNTER — Encounter (HOSPITAL_COMMUNITY): Payer: Self-pay

## 2016-08-21 ENCOUNTER — Encounter (HOSPITAL_COMMUNITY)
Admission: RE | Disposition: A | Discharge status: Home / Self Care | Payer: Self-pay | Source: Ambulatory Visit | Attending: Obstetrics and Gynecology

## 2016-08-21 HISTORY — PX: TUBAL LIGATION: SHX77

## 2016-08-21 LAB — CBC
HCT: 34.2 % — ABNORMAL LOW (ref 36.0–46.0)
Hemoglobin: 11.6 g/dL — ABNORMAL LOW (ref 12.0–15.0)
MCH: 29.2 pg (ref 26.0–34.0)
MCHC: 33.9 g/dL (ref 30.0–36.0)
MCV: 86.1 fL (ref 78.0–100.0)
Platelets: 165 10*3/uL (ref 150–400)
RBC: 3.97 MIL/uL (ref 3.87–5.11)
RDW: 13.6 % (ref 11.5–15.5)
WBC: 10.1 10*3/uL (ref 4.0–10.5)

## 2016-08-21 SURGERY — LIGATION, FALLOPIAN TUBE, POSTPARTUM
Anesthesia: Epidural | Site: Abdomen | Laterality: Bilateral

## 2016-08-21 MED ORDER — FENTANYL CITRATE (PF) 100 MCG/2ML IJ SOLN
INTRAMUSCULAR | Status: AC
  Administered 2016-08-21: 50 ug via INTRAVENOUS
  Filled 2016-08-21: qty 2

## 2016-08-21 MED ORDER — ACETAMINOPHEN 325 MG PO TABS: 650.0000 mg | ORAL_TABLET | ORAL | Status: DC | PRN

## 2016-08-21 MED ORDER — LACTATED RINGERS IV SOLN
INTRAVENOUS | Status: DC | PRN
  Administered 2016-08-21: 13:00:00 via INTRAVENOUS

## 2016-08-21 MED ORDER — OXYCODONE-ACETAMINOPHEN 5-325 MG PO TABS: 1.0000 | ORAL_TABLET | ORAL | Status: DC | PRN

## 2016-08-21 MED ORDER — DIPHENHYDRAMINE HCL 25 MG PO CAPS: 25.0000 mg | ORAL_CAPSULE | Freq: Four times a day (QID) | ORAL | Status: DC | PRN

## 2016-08-21 MED ORDER — SUGAMMADEX SODIUM 200 MG/2ML IV SOLN
INTRAVENOUS | Status: DC | PRN
  Administered 2016-08-21: 202.4 mg via INTRAVENOUS

## 2016-08-21 MED ORDER — SCOPOLAMINE 1 MG/3DAYS TD PT72
1.0000 | MEDICATED_PATCH | TRANSDERMAL | Status: DC
  Administered 2016-08-21: 1.5 mg via TRANSDERMAL

## 2016-08-21 MED ORDER — LIDOCAINE-EPINEPHRINE (PF) 2 %-1:200000 IJ SOLN
INTRAMUSCULAR | Status: AC
  Filled 2016-08-21: qty 20

## 2016-08-21 MED ORDER — PROPOFOL 10 MG/ML IV BOLUS
INTRAVENOUS | Status: DC | PRN
  Administered 2016-08-21: 200 mg via INTRAVENOUS

## 2016-08-21 MED ORDER — DEXAMETHASONE SODIUM PHOSPHATE 10 MG/ML IJ SOLN
INTRAMUSCULAR | Status: DC | PRN
  Administered 2016-08-21: 4 mg via INTRAVENOUS

## 2016-08-21 MED ORDER — ONDANSETRON HCL 4 MG/2ML IJ SOLN: 4.0000 mg | INTRAMUSCULAR | Status: DC | PRN

## 2016-08-21 MED ORDER — SUCCINYLCHOLINE CHLORIDE 20 MG/ML IJ SOLN
INTRAMUSCULAR | Status: DC | PRN
  Administered 2016-08-21: 100 mg via INTRAVENOUS

## 2016-08-21 MED ORDER — TETANUS-DIPHTH-ACELL PERTUSSIS 5-2.5-18.5 LF-MCG/0.5 IM SUSP: 0.5000 mL | Freq: Once | INTRAMUSCULAR | Status: DC

## 2016-08-21 MED ORDER — KETOROLAC TROMETHAMINE 30 MG/ML IJ SOLN
INTRAMUSCULAR | Status: AC
  Filled 2016-08-21: qty 1

## 2016-08-21 MED ORDER — PRENATAL MULTIVITAMIN CH: 1.0000 | ORAL_TABLET | Freq: Every day | ORAL | Status: DC

## 2016-08-21 MED ORDER — ROCURONIUM BROMIDE 100 MG/10ML IV SOLN
INTRAVENOUS | Status: AC
  Filled 2016-08-21: qty 1

## 2016-08-21 MED ORDER — MEASLES, MUMPS & RUBELLA VAC ~~LOC~~ INJ: 0.5000 mL | INJECTION | Freq: Once | SUBCUTANEOUS | Status: DC

## 2016-08-21 MED ORDER — MEDROXYPROGESTERONE ACETATE 150 MG/ML IM SUSP: 150.0000 mg | INTRAMUSCULAR | Status: DC | PRN

## 2016-08-21 MED ORDER — ACETAMINOPHEN 10 MG/ML IV SOLN
1000.0000 mg | Freq: Once | INTRAVENOUS | Status: AC
  Administered 2016-08-21: 1000 mg via INTRAVENOUS
  Filled 2016-08-21: qty 100

## 2016-08-21 MED ORDER — LACTATED RINGERS IV SOLN
INTRAVENOUS | Status: DC
  Administered 2016-08-21: 20 mL/h via INTRAVENOUS

## 2016-08-21 MED ORDER — MEPERIDINE HCL 25 MG/ML IJ SOLN
INTRAMUSCULAR | Status: AC
  Filled 2016-08-21: qty 1

## 2016-08-21 MED ORDER — ONDANSETRON HCL 4 MG/2ML IJ SOLN
INTRAMUSCULAR | Status: AC
  Filled 2016-08-21: qty 2

## 2016-08-21 MED ORDER — LANOLIN HYDROUS EX OINT: TOPICAL_OINTMENT | CUTANEOUS | Status: DC | PRN

## 2016-08-21 MED ORDER — DEXAMETHASONE SODIUM PHOSPHATE 4 MG/ML IJ SOLN
INTRAMUSCULAR | Status: AC
  Filled 2016-08-21: qty 1

## 2016-08-21 MED ORDER — ONDANSETRON HCL 4 MG PO TABS: 4.0000 mg | ORAL_TABLET | ORAL | Status: DC | PRN

## 2016-08-21 MED ORDER — SENNOSIDES-DOCUSATE SODIUM 8.6-50 MG PO TABS
2.0000 | ORAL_TABLET | ORAL | Status: DC
  Administered 2016-08-22: 2 via ORAL
  Filled 2016-08-21: qty 2

## 2016-08-21 MED ORDER — FENTANYL CITRATE (PF) 100 MCG/2ML IJ SOLN
25.0000 ug | INTRAMUSCULAR | Status: DC | PRN
  Administered 2016-08-21 (×2): 50 ug via INTRAVENOUS

## 2016-08-21 MED ORDER — SUGAMMADEX SODIUM 200 MG/2ML IV SOLN
INTRAVENOUS | Status: AC
  Filled 2016-08-21: qty 2

## 2016-08-21 MED ORDER — COCONUT OIL OIL: 1.0000 "application " | TOPICAL_OIL | Status: DC | PRN

## 2016-08-21 MED ORDER — KETOROLAC TROMETHAMINE 30 MG/ML IJ SOLN
INTRAMUSCULAR | Status: DC | PRN
  Administered 2016-08-21: 30 mg via INTRAVENOUS

## 2016-08-21 MED ORDER — METOCLOPRAMIDE HCL 10 MG PO TABS
10.0000 mg | ORAL_TABLET | Freq: Once | ORAL | Status: AC
  Administered 2016-08-21: 10 mg via ORAL
  Filled 2016-08-21: qty 1

## 2016-08-21 MED ORDER — ZOLPIDEM TARTRATE 5 MG PO TABS: 5.0000 mg | ORAL_TABLET | Freq: Every evening | ORAL | Status: DC | PRN

## 2016-08-21 MED ORDER — PROPOFOL 10 MG/ML IV BOLUS
INTRAVENOUS | Status: AC
  Filled 2016-08-21: qty 20

## 2016-08-21 MED ORDER — FENTANYL CITRATE (PF) 250 MCG/5ML IJ SOLN
INTRAMUSCULAR | Status: AC
  Filled 2016-08-21: qty 5

## 2016-08-21 MED ORDER — WITCH HAZEL-GLYCERIN EX PADS: 1.0000 "application " | MEDICATED_PAD | CUTANEOUS | Status: DC | PRN

## 2016-08-21 MED ORDER — BENZOCAINE-MENTHOL 20-0.5 % EX AERO: 1.0000 "application " | INHALATION_SPRAY | CUTANEOUS | Status: DC | PRN

## 2016-08-21 MED ORDER — FAMOTIDINE 20 MG PO TABS
40.0000 mg | ORAL_TABLET | Freq: Once | ORAL | Status: AC
  Administered 2016-08-21: 40 mg via ORAL
  Filled 2016-08-21: qty 2

## 2016-08-21 MED ORDER — DIBUCAINE 1 % RE OINT: 1.0000 "application " | TOPICAL_OINTMENT | RECTAL | Status: DC | PRN

## 2016-08-21 MED ORDER — MEPERIDINE HCL 25 MG/ML IJ SOLN
6.2500 mg | INTRAMUSCULAR | Status: DC | PRN
  Administered 2016-08-21: 12.5 mg via INTRAVENOUS

## 2016-08-21 MED ORDER — OXYCODONE-ACETAMINOPHEN 5-325 MG PO TABS
2.0000 | ORAL_TABLET | ORAL | Status: DC | PRN
  Administered 2016-08-21 – 2016-08-22 (×3): 2 via ORAL
  Filled 2016-08-21 (×3): qty 2

## 2016-08-21 MED ORDER — FENTANYL CITRATE (PF) 100 MCG/2ML IJ SOLN
INTRAMUSCULAR | Status: DC | PRN
  Administered 2016-08-21: 100 ug via INTRAVENOUS

## 2016-08-21 MED ORDER — PROMETHAZINE HCL 25 MG/ML IJ SOLN: 6.2500 mg | INTRAMUSCULAR | Status: DC | PRN

## 2016-08-21 MED ORDER — SIMETHICONE 80 MG PO CHEW: 80.0000 mg | CHEWABLE_TABLET | ORAL | Status: DC | PRN

## 2016-08-21 MED ORDER — IBUPROFEN 600 MG PO TABS
600.0000 mg | ORAL_TABLET | Freq: Four times a day (QID) | ORAL | Status: DC
  Administered 2016-08-21 – 2016-08-22 (×3): 600 mg via ORAL
  Filled 2016-08-21 (×3): qty 1

## 2016-08-21 MED ORDER — SODIUM BICARBONATE 8.4 % IV SOLN
INTRAVENOUS | Status: DC | PRN
  Administered 2016-08-21: 3 mL via EPIDURAL
  Administered 2016-08-21: 5 mL via EPIDURAL
  Administered 2016-08-21: 2 mL via EPIDURAL
  Administered 2016-08-21: 5 mL via EPIDURAL

## 2016-08-21 MED ORDER — MIDAZOLAM HCL 2 MG/2ML IJ SOLN
INTRAMUSCULAR | Status: AC
Start: 2016-08-21 — End: 2016-08-21
  Filled 2016-08-21: qty 2

## 2016-08-21 MED ORDER — BUPIVACAINE HCL (PF) 0.25 % IJ SOLN
INTRAMUSCULAR | Status: AC
  Filled 2016-08-21: qty 30

## 2016-08-21 MED ORDER — SCOPOLAMINE 1 MG/3DAYS TD PT72
MEDICATED_PATCH | TRANSDERMAL | Status: AC
  Administered 2016-08-21: 1.5 mg via TRANSDERMAL
  Filled 2016-08-21: qty 1

## 2016-08-21 MED ORDER — SODIUM BICARBONATE 8.4 % IV SOLN
INTRAVENOUS | Status: AC
  Filled 2016-08-21: qty 50

## 2016-08-21 MED ORDER — ROCURONIUM BROMIDE 100 MG/10ML IV SOLN
INTRAVENOUS | Status: DC | PRN
  Administered 2016-08-21: 15 mg via INTRAVENOUS

## 2016-08-21 MED ORDER — MIDAZOLAM HCL 5 MG/5ML IJ SOLN
INTRAMUSCULAR | Status: DC | PRN
  Administered 2016-08-21: 2 mg via INTRAVENOUS

## 2016-08-21 SURGICAL SUPPLY — 21 items
CLIP FILSHIE TUBAL LIGA STRL (Clip) ×6 IMPLANT
CLOTH BEACON ORANGE TIMEOUT ST (SAFETY) ×3 IMPLANT
CONTAINER PREFILL 10% NBF 15ML (MISCELLANEOUS) ×6 IMPLANT
DRSG OPSITE POSTOP 3X4 (GAUZE/BANDAGES/DRESSINGS) ×3 IMPLANT
GLOVE BIO SURGEON STRL SZ8 (GLOVE) ×3 IMPLANT
GLOVE BIOGEL PI IND STRL 7.0 (GLOVE) ×1 IMPLANT
GLOVE BIOGEL PI INDICATOR 7.0 (GLOVE) ×2
GLOVE SURG ORTHO 8.0 STRL STRW (GLOVE) ×6 IMPLANT
GOWN STRL REUS W/TWL LRG LVL3 (GOWN DISPOSABLE) ×6 IMPLANT
NEEDLE HYPO 22GX1.5 SAFETY (NEEDLE) ×3 IMPLANT
NS IRRIG 1000ML POUR BTL (IV SOLUTION) ×3 IMPLANT
PACK ABDOMINAL MINOR (CUSTOM PROCEDURE TRAY) ×3 IMPLANT
PROTECTOR NERVE ULNAR (MISCELLANEOUS) ×3 IMPLANT
SPONGE LAP 4X18 X RAY DECT (DISPOSABLE) ×3 IMPLANT
SUT VIC AB 0 CT1 27 (SUTURE) ×2
SUT VIC AB 0 CT1 27XBRD ANBCTR (SUTURE) ×1 IMPLANT
SUT VICRYL RAPIDE 3 0 (SUTURE) ×3 IMPLANT
SYR CONTROL 10ML LL (SYRINGE) ×3 IMPLANT
TOWEL OR 17X24 6PK STRL BLUE (TOWEL DISPOSABLE) ×6 IMPLANT
TRAY FOLEY CATH SILVER 14FR (SET/KITS/TRAYS/PACK) ×3 IMPLANT
WATER STERILE IRR 1000ML POUR (IV SOLUTION) ×3 IMPLANT

## 2016-08-21 NOTE — Transfer of Care (Signed)
Immediate Anesthesia Transfer of Care Note  Patient: Anita Beck  Procedure(s) Performed: Procedure(s): POST PARTUM TUBAL LIGATION (Bilateral)  Patient Location: PACU  Anesthesia Type:General  Level of Consciousness: awake, alert  and oriented  Airway & Oxygen Therapy: Patient Spontanous Breathing and Patient connected to nasal cannula oxygen  Post-op Assessment: Report given to RN and Post -op Vital signs reviewed and stable  Post vital signs: Reviewed and stable  Last Vitals:  Vitals:   08/21/16 0608 08/21/16 1100  BP: (!) 119/53 131/67  Pulse: 85 81  Resp: 20 20  Temp: 36.7 C 37.1 C    Last Pain:  Vitals:   08/21/16 1100  TempSrc: Oral  PainSc:          Complications: No apparent anesthesia complications

## 2016-08-21 NOTE — Anesthesia Postprocedure Evaluation (Signed)
Anesthesia Post Note  Patient: Anita Beck  Procedure(s) Performed: Procedure(s) (LRB): POST PARTUM TUBAL LIGATION (Bilateral)  Patient location during evaluation: Mother Baby Anesthesia Type: General Level of consciousness: awake and alert Pain management: pain level controlled Vital Signs Assessment: post-procedure vital signs reviewed and stable Respiratory status: spontaneous breathing Cardiovascular status: blood pressure returned to baseline Postop Assessment: no headache and adequate PO intake     Last Vitals:  Vitals:   08/21/16 1600 08/21/16 1617  BP: 128/71 134/70  Pulse: 65   Resp: 15 18  Temp:  37.1 C    Last Pain:  Vitals:   08/21/16 1620  TempSrc:   PainSc: 2    Pain Goal:                 Ailene Ards

## 2016-08-21 NOTE — Anesthesia Postprocedure Evaluation (Signed)
Anesthesia Post Note  Patient: Anita Beck  Procedure(s) Performed: Procedure(s) (LRB): POST PARTUM TUBAL LIGATION (Bilateral)  Patient location during evaluation: PACU Anesthesia Type: General Level of consciousness: awake and sedated Pain management: pain level controlled Vital Signs Assessment: post-procedure vital signs reviewed and stable Respiratory status: spontaneous breathing Cardiovascular status: stable Postop Assessment: no signs of nausea or vomiting Anesthetic complications: no     Last Vitals:  Vitals:   08/21/16 1420 08/21/16 1430  BP: 122/69 123/73  Pulse:  94  Resp: 16 16  Temp: 36.8 C     Last Pain:  Vitals:   08/21/16 1100  TempSrc: Oral  PainSc:    Pain Goal:                 Deseri Loss JR,JOHN Kerilyn Cortner

## 2016-08-21 NOTE — Anesthesia Procedure Notes (Signed)
Procedure Name: Intubation Date/Time: 08/21/2016 1:31 PM Performed by: Hewitt Blade Pre-anesthesia Checklist: Patient identified, Emergency Drugs available, Suction available and Patient being monitored Patient Re-evaluated:Patient Re-evaluated prior to inductionOxygen Delivery Method: Circle system utilized Preoxygenation: Pre-oxygenation with 100% oxygen Intubation Type: IV induction, Rapid sequence and Cricoid Pressure applied Laryngoscope Size: Mac and 3 Grade View: Grade I Tube type: Oral Tube size: 7.0 mm Number of attempts: 1 Airway Equipment and Method: Stylet Placement Confirmation: ETT inserted through vocal cords under direct vision,  positive ETCO2 and breath sounds checked- equal and bilateral Secured at: 21 cm Tube secured with: Tape Dental Injury: Teeth and Oropharynx as per pre-operative assessment

## 2016-08-21 NOTE — Op Note (Signed)
Pre op:  Multiparity and desires sterility Post op:  Same Procedure:  Postpartum Bilateral Tubal ligation by Filshie Clip Surgeon:  Corinna Capra Anesthesia:  Epidural Procedure:  After informed consent and sterile prep and drape, time out was carried out.  A 2 cm infraumbilical skin incision was made and taken down sharply to the fascia which was incised with mayo scissors and the peritoneum identified and opened sharply.  Army/[employer] retractors placed.  The fallopian tubes were identified by their fimbriated ends and filshie clips were placed across the tubes at the midportion of each tube.  Good placement was noted bilaterally. The fascia was then closed with 0 vicryl.  The skin closed with 3-0 vicryl rapide in a subcuticular stitch with good approximation noted.  The incision was then infiltrated with .25% marcaine, 10cc.  The patient was stable on transfer to the recovery room.  EBL Minimal.  Sponge and instrument counts were normal.  No complications.

## 2016-08-21 NOTE — Progress Notes (Signed)
Post Partum Day 1 Subjective: no complaints, up ad lib, voiding and plan for tubal ligation @12 :45.  Objective: Blood pressure (!) 119/53, pulse 85, temperature 98.1 F (36.7 C), temperature source Oral, resp. rate 20, height 5\' 6"  (1.676 m), weight 223 lb (101.2 kg), SpO2 100 %, unknown if currently breastfeeding.  Physical Exam:  General: alert and cooperative Lochia: appropriate Uterine Fundus: firm Incision: perineum intact DVT Evaluation: No evidence of DVT seen on physical exam. Negative Homan's sign. No cords or calf tenderness. No significant calf/ankle edema.   Recent Labs  08/20/16 0730 08/21/16 0537  HGB 12.7 11.6*  HCT 36.9 34.2*    Assessment/Plan: Plan for discharge tomorrow   LOS: 1 day   Karlita Lichtman G 08/21/2016, 8:01 AM

## 2016-08-21 NOTE — Progress Notes (Signed)
Pt is SP SVD,  Multiparity and desires sterility.  Discussed risks and benefits of sterilization including but not limited to risk of tubal failure quoted as 02/999.  She gives her informed consent.Patient ID: Anita Beck, female   DOB: [birth year], 30 y.o.   MRN: [id number]

## 2016-08-21 NOTE — Anesthesia Preprocedure Evaluation (Signed)
Anesthesia Evaluation  Patient identified by MRN, date of birth, ID band Patient awake    Reviewed: Allergy & Precautions, Patient's Chart, lab work & pertinent test results  History of Anesthesia Complications (+) PONV and history of anesthetic complications  Airway Mallampati: II  TM Distance: >3 FB Neck ROM: Full    Dental  (+) Teeth Intact   Pulmonary asthma ,    breath sounds clear to auscultation       Cardiovascular negative cardio ROS   Rhythm:Regular Rate:Normal     Neuro/Psych  Headaches, PSYCHIATRIC DISORDERS Anxiety Depression    GI/Hepatic negative GI ROS, Neg liver ROS,   Endo/Other  Hyperthyroidism   Renal/GU   negative genitourinary   Musculoskeletal negative musculoskeletal ROS (+)   Abdominal   Peds negative pediatric ROS (+)  Hematology negative hematology ROS (+)   Anesthesia Other Findings   Reproductive/Obstetrics (+) Pregnancy                             Lab Results  Component Value Date   WBC 10.1 08/21/2016   HGB 11.6 (L) 08/21/2016   HCT 34.2 (L) 08/21/2016   MCV 86.1 08/21/2016   PLT 165 08/21/2016   No results found for: INR, PROTIME   Anesthesia Physical  Anesthesia Plan  ASA: II  Anesthesia Plan: Epidural   Post-op Pain Management:    Induction:   Airway Management Planned:   Additional Equipment:   Intra-op Plan:   Post-operative Plan:   Informed Consent: I have reviewed the patients History and Physical, chart, labs and discussed the procedure including the risks, benefits and alternatives for the proposed anesthesia with the patient or authorized representative who has indicated his/her understanding and acceptance.   Dental advisory given  Plan Discussed with: CRNA  Anesthesia Plan Comments:         Anesthesia Quick Evaluation

## 2016-08-21 NOTE — Progress Notes (Signed)
MOB was referred for history of depression/anxiety. * Referral screened out by Clinical Social Worker because none of the following criteria appear to apply: ~ History of anxiety/depression during this pregnancy, or of post-partum depression. ~ Diagnosis of anxiety and/or depression within last 3 years OR * MOB's symptoms currently being treated with medication and/or therapy.  CSW educated MOB about PPD.  MOB denied any PPD signs and symptoms with MOB's older 2 children.  MOB asked appropriate questions and appeared informed.  CSW informed MOB of possible supports and interventions to decrease PPD.  CSW also encouraged MOB to seek medical attention if needed for increased signs and symptoms for PPD.     Please contact the Clinical Social Worker if needs arise, or if MOB requests.  Laurey Arrow, MSW, LCSW Clinical Social Work [phone]

## 2016-08-22 ENCOUNTER — Encounter (HOSPITAL_COMMUNITY): Payer: Self-pay | Admitting: Obstetrics and Gynecology

## 2016-08-22 LAB — CBC
HCT: 34.6 % — ABNORMAL LOW (ref 36.0–46.0)
Hemoglobin: 11.7 g/dL — ABNORMAL LOW (ref 12.0–15.0)
MCH: 29.4 pg (ref 26.0–34.0)
MCHC: 33.8 g/dL (ref 30.0–36.0)
MCV: 86.9 fL (ref 78.0–100.0)
Platelets: 191 10*3/uL (ref 150–400)
RBC: 3.98 MIL/uL (ref 3.87–5.11)
RDW: 13.8 % (ref 11.5–15.5)
WBC: 10.9 10*3/uL — ABNORMAL HIGH (ref 4.0–10.5)

## 2016-08-22 MED ORDER — IBUPROFEN 600 MG PO TABS: 600.0000 mg | ORAL_TABLET | Freq: Four times a day (QID) | ORAL | 1 refills | Status: AC

## 2016-08-22 MED ORDER — OXYCODONE-ACETAMINOPHEN 5-325 MG PO TABS: 1.0000 | ORAL_TABLET | ORAL | 0 refills | Status: DC | PRN

## 2016-08-22 NOTE — Discharge Summary (Signed)
Obstetric Discharge Summary Reason for Admission: induction of labor Prenatal Procedures: ultrasound Intrapartum Procedures: spontaneous vaginal delivery Postpartum Procedures: pp tubal ligation Complications-Operative and Postpartum: none Hemoglobin  Date Value Ref Range Status  08/22/2016 11.7 (L) 12.0 - 15.0 g/dL Final   HCT  Date Value Ref Range Status  08/22/2016 34.6 (L) 36.0 - 46.0 % Final    Physical Exam:  General: alert and cooperative Lochia: appropriate Uterine Fundus: firm Incision: healing well DVT Evaluation: No evidence of DVT seen on physical exam. Negative Homan's sign. No cords or calf tenderness. Calf/Ankle edema is present.  Discharge Diagnoses: Term Pregnancy-delivered  Discharge Information: Date: 08/22/2016 Activity: pelvic rest Diet: routine Medications: PNV, Ibuprofen and Percocet Condition: stable Instructions: refer to practice specific booklet Discharge to: home   Newborn Data: Live born female  Birth Weight: 7 lb 13.9 oz (3570 g) APGAR: 9, 9  Home with mother.  CURTIS,CAROL G 08/22/2016, 7:30 AM

## 2016-11-11 ENCOUNTER — Encounter (HOSPITAL_COMMUNITY): Payer: Self-pay | Admitting: *Deleted

## 2016-11-11 ENCOUNTER — Inpatient Hospital Stay (HOSPITAL_COMMUNITY)
Admission: AD | Admit: 2016-11-11 | Discharge: 2016-11-11 | Disposition: A | Discharge status: Home / Self Care | Payer: BLUE CROSS/BLUE SHIELD | Source: Ambulatory Visit | Attending: Obstetrics and Gynecology | Admitting: Obstetrics and Gynecology

## 2016-11-11 DIAGNOSIS — N898 Other specified noninflammatory disorders of vagina: Secondary | ICD-10-CM | POA: Diagnosis present

## 2016-11-11 DIAGNOSIS — Z3201 Encounter for pregnancy test, result positive: Secondary | ICD-10-CM | POA: Diagnosis not present

## 2016-11-11 DIAGNOSIS — F430 Acute stress reaction: Secondary | ICD-10-CM

## 2016-11-11 DIAGNOSIS — Z79899 Other long term (current) drug therapy: Secondary | ICD-10-CM | POA: Diagnosis not present

## 2016-11-11 LAB — URINALYSIS, ROUTINE W REFLEX MICROSCOPIC
Bilirubin Urine: NEGATIVE
Glucose, UA: NEGATIVE mg/dL
Hgb urine dipstick: NEGATIVE
Ketones, ur: 80 mg/dL — AB
Leukocytes, UA: NEGATIVE
Nitrite: NEGATIVE
Protein, ur: NEGATIVE mg/dL
Specific Gravity, Urine: 1.008 (ref 1.005–1.030)
pH: 5 (ref 5.0–8.0)

## 2016-11-11 LAB — CBC
HCT: 40.8 % (ref 36.0–46.0)
Hemoglobin: 14.1 g/dL (ref 12.0–15.0)
MCH: 29.6 pg (ref 26.0–34.0)
MCHC: 34.6 g/dL (ref 30.0–36.0)
MCV: 85.5 fL (ref 78.0–100.0)
Platelets: 248 10*3/uL (ref 150–400)
RBC: 4.77 MIL/uL (ref 3.87–5.11)
RDW: 15.9 % — ABNORMAL HIGH (ref 11.5–15.5)
WBC: 6.9 10*3/uL (ref 4.0–10.5)

## 2016-11-11 LAB — POCT PREGNANCY, URINE: Preg Test, Ur: NEGATIVE

## 2016-11-11 LAB — HCG, QUANTITATIVE, PREGNANCY: hCG, Beta Chain, Quant, S: <1 m[IU]/mL (ref <5)

## 2016-11-11 NOTE — MAU Provider Note (Signed)
History   Patient Anita Beck is a 31 year old G3P2103 here after taking 5 positive pregnancy tests at home. She is very concerned because she had a tubal ligation in October. When I talked to patient, she said that the test turned positive after 6 minutes but was negative at 3 min. SHe says that her pregnancy tests were always positive at the 6 minute mark, not the 3 minute mark, with her other children. She said that this week she started feeling moody and having more vaginal discharge than normal. She says that she bleed until just before Christmas and then had some spotting through the [place] but has never gotten her period. She and her husband had intercourse around Christmas, so she thinks she would be about [redacted] weeks pregnant if she is pregnant.  She became very anxious today when she put together the fact that she hadn't had a period and was feeling moody and that she was having increased discharge and so she decided to come to get checked out. She called Dr. Royston Sinner and Dr. Royston Sinner said to come to the office tomorrow (11/13/16) but she wanted to come in tonight instead.   CSN: [id number]  Arrival date and time: 11/11/16 1839   None     Chief Complaint  Patient presents with  . Possible Pregnancy   HPI  OB History    Gravida Para Term Preterm AB Living   3 3 2 1  0 3   SAB TAB Ectopic Multiple Live Births   0 0 0 0 3      Past Medical History:  Diagnosis Date  . Anxiety   . Asthma   . Complication of anesthesia    slow to wake up , itching   . Depression    pt was 31 yo-no longer medicated, mild PP depression first child  . Hyperthyroidism   . Kidney stones   . Migraines   . PONV (postoperative nausea and vomiting)    nausea only     Past Surgical History:  Procedure Laterality Date  . APPENDECTOMY    . CHOLECYSTECTOMY    . CYSTOSCOPY WITH RETROGRADE PYELOGRAM, URETEROSCOPY AND STENT PLACEMENT Left 06/02/2015   Procedure: CYSTOSCOPY WITH RETROGRADE PYELOGRAM,  URETEROSCOPY AND STENT PLACEMENT;  Surgeon: Alexis Frock, MD;  Location: [facility];  Service: Urology;  Laterality: Left;  . HOLMIUM LASER APPLICATION Left [id number]   Procedure: WITH HOLMIUM LASER LITHOTRIPSY;  Surgeon: Alexis Frock, MD;  Location: [facility];  Service: Urology;  Laterality: Left;  . LITHOTRIPSY    . PARATHYROIDECTOMY  2010  . TUBAL LIGATION Bilateral 08/21/2016   Procedure: POST PARTUM TUBAL LIGATION;  Surgeon: Louretta Shorten, MD;  Location: [facility];  Service: Gynecology;  Laterality: Bilateral;    Family History  Problem Relation Age of Onset  . Hypertension Father   . Diabetes Father   . Heart disease Father   . Heart disease Mother   . Heart disease Maternal Grandmother   . Heart disease Paternal Grandmother   . Diabetes Paternal Grandmother   . Cancer Paternal Grandmother   . Cancer Paternal Grandfather     Social History  Substance Use Topics  . Smoking status: Never Smoker  . Smokeless tobacco: Never Used  . Alcohol use No    Allergies:  Allergies  Allergen Reactions  . Aspirin Anaphylaxis    Pt states that she can take ibuprofen    Prescriptions Prior to Admission  Medication Sig Dispense Refill Last Dose  .  acetaminophen (TYLENOL) 500 MG tablet Take 1,000 mg by mouth every 6 (six) hours as needed for mild pain, moderate pain or headache.   Past Week at Unknown time  . ibuprofen (ADVIL,MOTRIN) 600 MG tablet Take 1 tablet (600 mg total) by mouth every 6 (six) hours. 30 tablet 1   . oxyCODONE-acetaminophen (PERCOCET/ROXICET) 5-325 MG tablet Take 1 tablet by mouth every 4 (four) hours as needed (pain scale 4-7). 30 tablet 0   . Prenatal Vit-Fe Fumarate-FA (PRENATAL MULTIVITAMIN) TABS tablet Take 1 tablet by mouth daily.    08/19/2016 at Unknown time    Review of Systems  Constitutional: Negative.   HENT: Negative.   Eyes: Negative.   Respiratory: Negative.   Cardiovascular: Negative.   Gastrointestinal: Negative.    Physical Exam   Blood pressure  148/75, pulse 104, temperature 97.3 F (36.3 C), temperature source Oral, resp. rate 18, last menstrual period 11/03/2016, unknown if currently breastfeeding.  Physical Exam  Constitutional: She is oriented to person, place, and time. She appears well-developed.  Neck: Normal range of motion.  Respiratory: Effort normal.  GI: Soft.  Musculoskeletal: Normal range of motion.  Neurological: She is alert and oriented to person, place, and time.  Skin: Skin is warm and dry.    MAU Course  Procedures  MDM -beta hcg -UPT:  Assessment and Plan  Patient's beta is undectable per phone call from the lab. Per Dr. Royston Sinner, patient to be discharged home and to follow up with [facility] in the morning.   Mervyn Skeeters [place] CNM 11/11/2016, 8:25 PM

## 2016-11-11 NOTE — Discharge Instructions (Signed)
Postpartum Tubal Ligation, Care After Refer to this sheet in the next few weeks. These instructions provide you with information about caring for yourself after your procedure. Your health care provider may also give you more specific instructions. Your treatment has been planned according to current medical practices, but problems sometimes occur. Call your health care provider if you have any problems or questions after your procedure. What can I expect after the procedure? After the procedure, it is common to have:  A sore throat.  Bruising or pain in your back.  Nausea or vomiting.  Dizziness.  Mild abdominal discomfort or pain, such as cramping, gas pain, or feeling bloated.  Soreness where the incision was made.  Tiredness.  Pain in your shoulders. Follow these instructions at home: Medicines  Take over-the-counter and prescription medicines only as told by your health care provider.  Do not take aspirin because it can cause bleeding.  Do not drive or operate heavy machinery while taking prescription pain medicine. Activity  Rest for the rest of the day.  Gradually return to your normal activities over the next few days.  Do not have sex, douche, or put a tampon or anything else in your vagina for 6 weeks or as long as told by your health care provider.  Do not lift anything that is heavier than your baby for 2 weeks or as long as told by your health care provider. Incision care  Follow instructions from your health care provider about how to take care of your incision. Make sure you:  Wash your hands with soap and water before you change your bandage (dressing). If soap and water are not available, use hand sanitizer.  Change your dressing as told by your health care provider.  Leave stitches (sutures) in place. They may need to stay in place for 2 weeks or longer.  Check your incision area every day for signs of infection. Check for:  More redness, swelling,  or pain.  More fluid or blood.  Warmth.  Pus or a bad smell. Other Instructions  Do not take baths, swim, or use a hot tub until your health care provider approves. You may take showers.  Keep all follow-up visits as told by your health care provider. This is important. Contact a health care provider if:  You have more redness, swelling, or pain around your incision.  Your incision feels warm to the touch.  You have pus or a bad smell coming from your incision.  The edges of your incision break open after the sutures have been removed.  Your pain does not improve after 2-3 days.  You have a rash.  You repeatedly become dizzy or lightheaded.  Your pain medicine is not helping.  You are constipated. Get help right away if:  You have a fever.  You faint.  You have pain in your abdomen that gets worse.  You have fluid or blood coming from your sutures.  You have shortness of breath or difficulty breathing.  You have chest pain or leg pain.  You have ongoing nausea or diarrhea. This information is not intended to replace advice given to you by your health care provider. Make sure you discuss any questions you have with your health care provider. Document Released: 04/15/2012 Document Revised: 03/19/2016 Document Reviewed: 09/25/2015 Elsevier Interactive Patient Education  2017 [employer].

## 2016-11-11 NOTE — MAU Note (Signed)
Patient present with positive UPT x 5, had tubal ligation on 08/21/16 after birth of last child on 08/20/16.

## 2017-06-05 ENCOUNTER — Other Ambulatory Visit: Payer: Self-pay | Admitting: Obstetrics and Gynecology

## 2018-05-09 IMAGING — CR DG ANKLE 2V *L*
2 series · 2 of 2 positions shown · non-contrast
Comparison: None.

CLINICAL DATA: 29-year-old who stepped in a hole while outside and
sustained a twisting injury to the left ankle. Lateral pain and
swelling. Initial encounter. Patient is approximately 35 weeks
pregnant.

EXAM:
LEFT ANKLE - 2 VIEW

[ankle ap]
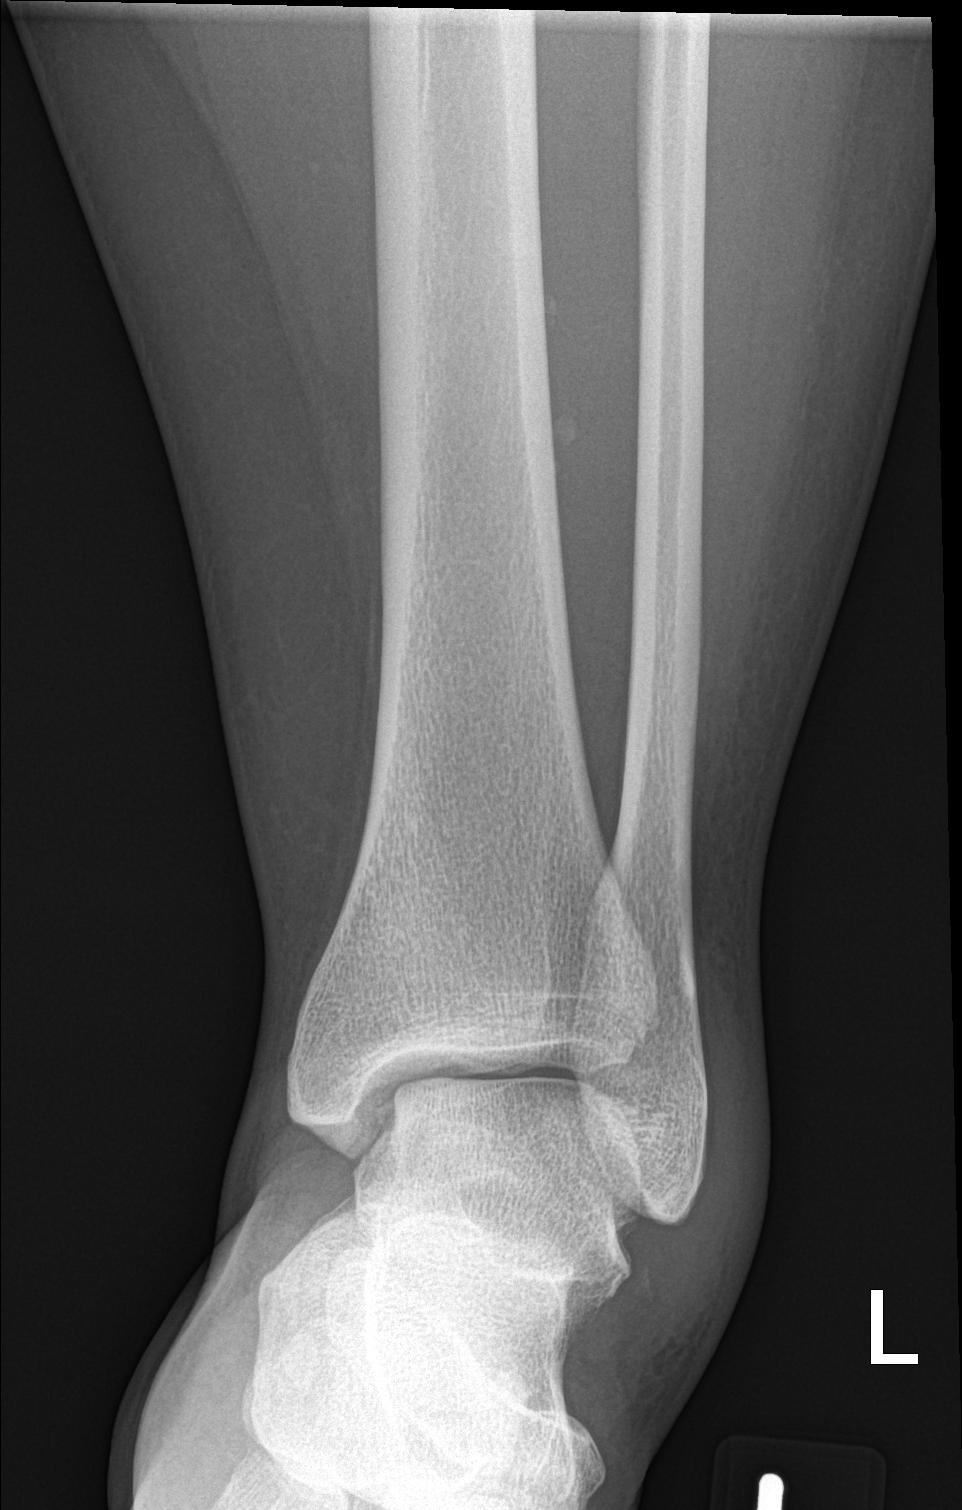

[ankle lat]
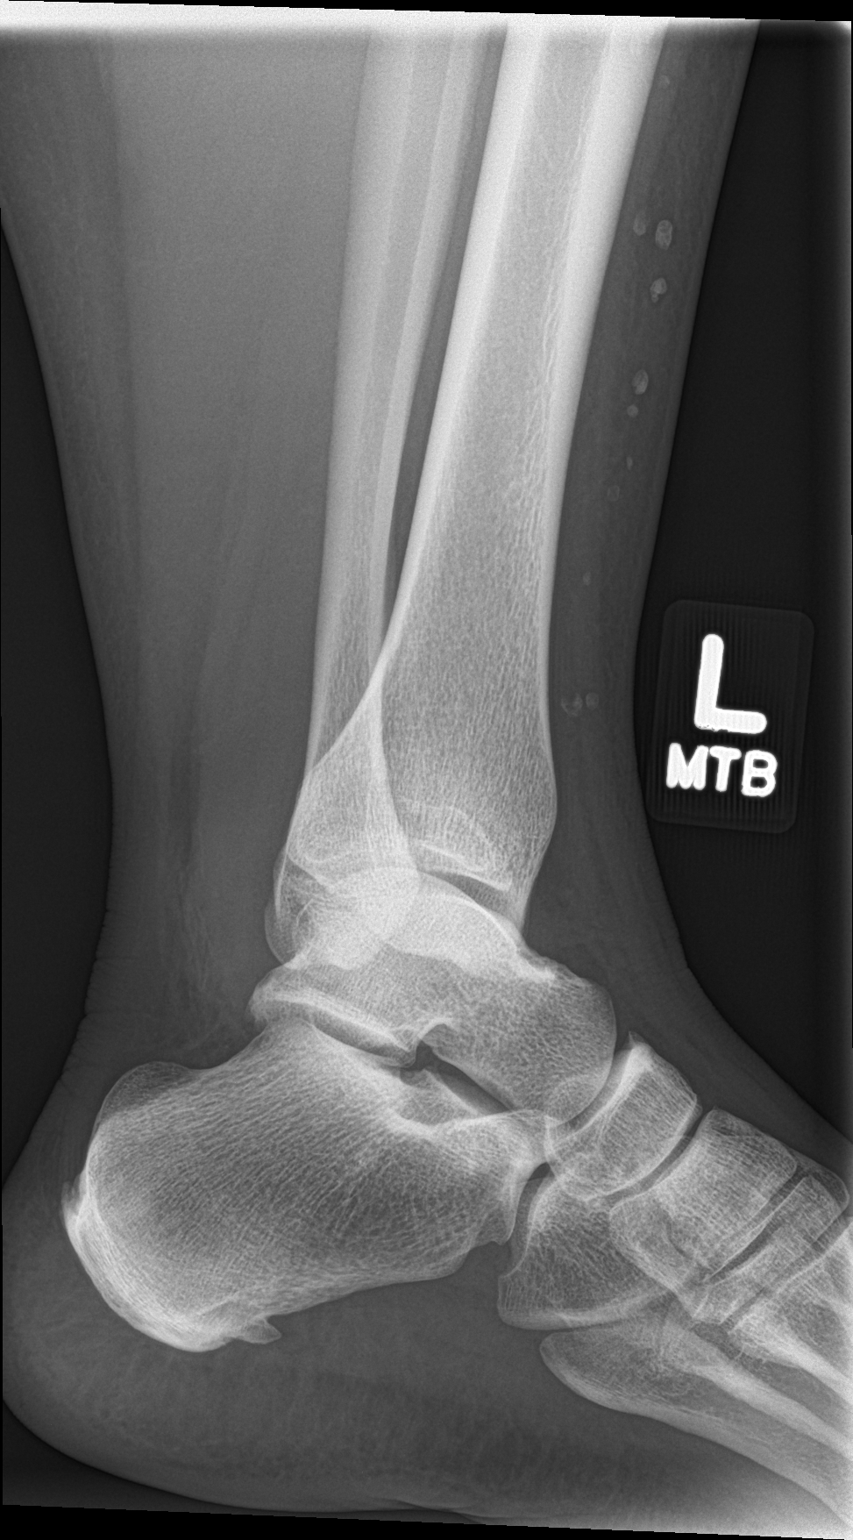

[2 of 2 positions shown; findings below may reference images not displayed]

FINDINGS: Patient was shielded for the examination. Marked lateral soft tissue
swelling. No evidence of acute fracture or dislocation. Ankle
mortise intact with well-preserved joint space. Bone mineral density
well-preserved. Small plantar calcaneal spur. No other intrinsic
osseous abnormalities. Note made of phleboliths in the subcutaneous
tissues of the anterior lower leg.
IMPRESSION: No acute osseous abnormality.  Small plantar calcaneal spur.
